# Patient Record
Sex: Female | Born: 1990 | Hispanic: No | Marital: Married | Smoking: Never smoker
Health system: Southern US, Community
[De-identification: ages and names within clinical notes are randomized; demographics above are authoritative.]

## PROBLEM LIST (undated history)

## (undated) ENCOUNTER — Emergency Department (HOSPITAL_COMMUNITY): Payer: Medicaid Other

## (undated) DIAGNOSIS — Z789 Other specified health status: Secondary | ICD-10-CM

---

## 2019-10-19 NOTE — L&D Delivery Note (Signed)
OB/GYN Faculty Practice Delivery Note  Catherine Holder is a 29 y.o. G1P0 s/p SVD at [redacted]w[redacted]d. She was admitted for IOL for decreased fetal movement.   ROM: 0h 26m with clear fluid GBS Status:  Negative/-- (08/18 1123) Maximum Maternal Temperature: n/a  Labor Progress: . Initial SVE: 1/thick/high. Patient received two doses of cytotec, a FB, and pitocin.  AROM at time that patient was complete.   Delivery Date/Time: 9/9 0623  Delivery: Called to room and patient was complete and pushing. Head delivered ROA. Nuchal cord present and reduced. Shoulder and body delivered in usual fashion. Infant with spontaneous cry, placed on mother's abdomen, dried and stimulated. Cord clamped x 2 after 1-minute delay, and cut by FOB. Cord blood drawn. Placenta delivered spontaneously with gentle cord traction. Fundus firm with massage and Pitocin. Labia, perineum, vagina, and cervix inspected inspected with second degree perineal laceration, repaired in routine fashion with 3-0 Vicryl.   Baby Weight: pending  Placenta: Sent to L&D Complications: None Lacerations: second degree perineal, repaired EBL: 100 mL Analgesia: Epidural    Infant:  APGAR (1 MIN):  Pending APGAR (5 MINS):  Pending APGAR (10 MINS):     Casper Harrison, MD [facility] Family Medicine Fellow, [facility] for [facility], [facility] 06/26/2020, 7:32 AM

## 2019-12-27 ENCOUNTER — Ambulatory Visit (INDEPENDENT_AMBULATORY_CARE_PROVIDER_SITE_OTHER): Payer: Self-pay

## 2019-12-27 DIAGNOSIS — Z3A00 Weeks of gestation of pregnancy not specified: Secondary | ICD-10-CM

## 2019-12-27 DIAGNOSIS — Z3400 Encounter for supervision of normal first pregnancy, unspecified trimester: Secondary | ICD-10-CM | POA: Insufficient documentation

## 2019-12-27 DIAGNOSIS — Z3401 Encounter for supervision of normal first pregnancy, first trimester: Secondary | ICD-10-CM

## 2019-12-27 NOTE — Progress Notes (Signed)
Patient seen and assessed by nursing staff during this encounter. I have reviewed the chart and agree with the documentation and plan.  Catalina Antigua, MD 12/27/2019 9:45 AM

## 2019-12-27 NOTE — Progress Notes (Signed)
I connected with  Catherine Holder on 12/27/19 by a video enabled telemedicine application and verified that I am speaking with the correct person using two identifiers.   OB History    Gravida  1   Para      Term      Preterm      AB      Living        SAB      TAB      Ectopic      Multiple      Live Births             Assessment   Normal pregnany   Objective     Plan   F/u 01/03/20 for new ob visit

## 2020-01-03 ENCOUNTER — Other Ambulatory Visit: Payer: Self-pay

## 2020-01-03 ENCOUNTER — Other Ambulatory Visit (HOSPITAL_COMMUNITY)
Admission: RE | Admit: 2020-01-03 | Discharge: 2020-01-03 | Disposition: A | Discharge status: Home / Self Care | Payer: Medicaid Other | Source: Ambulatory Visit | Attending: Obstetrics & Gynecology | Admitting: Obstetrics & Gynecology

## 2020-01-03 ENCOUNTER — Encounter: Payer: Self-pay | Admitting: Obstetrics & Gynecology

## 2020-01-03 ENCOUNTER — Ambulatory Visit (INDEPENDENT_AMBULATORY_CARE_PROVIDER_SITE_OTHER): Payer: Medicaid Other | Admitting: Obstetrics & Gynecology

## 2020-01-03 VITALS — BP 96/65 | HR 79 | Wt 158.0 lb

## 2020-01-03 DIAGNOSIS — Z3401 Encounter for supervision of normal first pregnancy, first trimester: Secondary | ICD-10-CM | POA: Insufficient documentation

## 2020-01-03 DIAGNOSIS — Z3402 Encounter for supervision of normal first pregnancy, second trimester: Secondary | ICD-10-CM

## 2020-01-03 DIAGNOSIS — Z3A22 22 weeks gestation of pregnancy: Secondary | ICD-10-CM

## 2020-01-03 DIAGNOSIS — Z3A15 15 weeks gestation of pregnancy: Secondary | ICD-10-CM

## 2020-01-03 MED ORDER — VITAFOL GUMMIES 3.33-0.333-34.8 MG PO CHEW: 3.0000 | CHEWABLE_TABLET | Freq: Every day | ORAL | 11 refills | Status: DC

## 2020-01-03 NOTE — Progress Notes (Signed)
History:   Catherine Holder is a 29 y.o. G1P0 at [redacted]w[redacted]d by LMP being seen today for her first obstetrical visit. No issues since finding out she was pregnant, no concerns.      HISTORY: OB History  Gravida Para Term Preterm AB Living  1 0 0 0 0 0  SAB TAB Ectopic Multiple Live Births  0 0 0 0 0    # Outcome Date GA Lbr Len/2nd Weight Sex Delivery Anes PTL Lv  1 Current            History reviewed. No pertinent past medical history. History reviewed. No pertinent surgical history. History reviewed. No pertinent family history. Social History   Tobacco Use  . Smoking status: Never Smoker  . Smokeless tobacco: Never Used  Substance Use Topics  . Alcohol use: Never  . Drug use: Never   No Known Allergies No current outpatient medications on file prior to visit.   No current facility-administered medications on file prior to visit.    Review of Systems Pertinent items noted in HPI and remainder of comprehensive ROS otherwise negative. Physical Exam:   Vitals:   01/03/20 0922  BP: 96/65  Pulse: 79  Weight: 158 lb (71.7 kg)   Fetal Heart Rate (bpm): 155 Uterus:     Pelvic Exam: Perineum: no hemorrhoids, normal perineum   Vulva: normal external genitalia, no lesions   Vagina:  normal mucosa, normal discharge   Cervix: unable to visualize, pap smear was unable to be done secondary to patient's inability to tolerate speculum exam   Adnexa: not examined   Bony Pelvis: average  System: General: well-developed, well-nourished female in no acute distress   Breasts:  normal appearance, no masses or tenderness bilaterally   Skin: normal coloration and turgor, no rashes   Neurologic: oriented, normal, negative, normal mood   Extremities: normal strength, tone, and muscle mass, ROM of all joints is normal   HEENT PERRLA, extraocular movement intact and sclera clear, anicteric   Mouth/Teeth mucous membranes moist, pharynx normal without lesions and dental hygiene good   Neck  supple and no masses   Cardiovascular: regular rate and rhythm   Respiratory:  no respiratory distress, normal breath sounds   Abdomen: soft, non-tender; bowel sounds normal; no masses,  no organomegaly    Assessment:    Pregnancy: G1P0 Patient Active Problem List   Diagnosis Date Noted  . Encounter for supervision of normal first pregnancy 12/27/2019     Plan:    1. Encounter for supervision of normal first pregnancy in second trimester - Cervicovaginal ancillary only( [place]) - Obstetric Panel, Including HIV - Genetic Screening - Culture, OB Urine - Prenatal Vit-Fe Phos-FA-Omega (VITAFOL GUMMIES) 3.33-0.333-34.8 MG CHEW; Chew 3 each by mouth daily.  Dispense: 90 tablet; Refill: 11 - [place] MFM OB COMP + 14 WK; Future - Hepatitis C antibody - Hemoglobin A1c - Comprehensive metabolic panel - AFP, Serum, Open Spina Bifida Initial labs drawn. Pap smear to be attempted again postpartum.  Continue prenatal vitamins. Genetic Screening discussed, NIPS: ordered. Ultrasound discussed; fetal anatomic survey: ordered. Problem list reviewed and updated. The nature of [place] [facility] Practice with multiple MDs and other Advanced Practice Providers was explained to patient; also emphasized that residents, students are part of our team. Routine obstetric precautions reviewed. Return in about 4 weeks (around 01/31/2020) for Virtual OB Visit.     Jaynie Collins, MD, FACOG Obstetrician & Gynecologist, [facility] for [facility]  Healthcare, [place]

## 2020-01-03 NOTE — Patient Instructions (Signed)

## 2020-01-04 LAB — CERVICOVAGINAL ANCILLARY ONLY
Bacterial Vaginitis (gardnerella): NEGATIVE
Candida Glabrata: NEGATIVE
Candida Vaginitis: NEGATIVE
Chlamydia: NEGATIVE
Comment: NEGATIVE
Comment: NEGATIVE
Comment: NEGATIVE
Comment: NEGATIVE
Comment: NEGATIVE
Comment: NORMAL
Neisseria Gonorrhea: NEGATIVE
Trichomonas: NEGATIVE

## 2020-01-05 LAB — URINE CULTURE, OB REFLEX

## 2020-01-05 LAB — CULTURE, OB URINE

## 2020-01-09 LAB — OBSTETRIC PANEL, INCLUDING HIV
Antibody Screen: NEGATIVE
Basophils Absolute: 0 10*3/uL (ref 0.0–0.2)
Basos: 0 %
EOS (ABSOLUTE): 0.1 10*3/uL (ref 0.0–0.4)
Eos: 1 %
HIV Screen 4th Generation wRfx: NONREACTIVE
Hematocrit: 40.3 % (ref 34.0–46.6)
Hemoglobin: 13.6 g/dL (ref 11.1–15.9)
Hepatitis B Surface Ag: NEGATIVE
Immature Grans (Abs): 0 10*3/uL (ref 0.0–0.1)
Immature Granulocytes: 0 %
Lymphocytes Absolute: 1.7 10*3/uL (ref 0.7–3.1)
Lymphs: 19 %
MCH: 31.5 pg (ref 26.6–33.0)
MCHC: 33.7 g/dL (ref 31.5–35.7)
MCV: 93 fL (ref 79–97)
Monocytes Absolute: 0.5 10*3/uL (ref 0.1–0.9)
Monocytes: 5 %
Neutrophils Absolute: 6.7 10*3/uL (ref 1.4–7.0)
Neutrophils: 75 %
Platelets: 219 10*3/uL (ref 150–450)
RBC: 4.32 x10E6/uL (ref 3.77–5.28)
RDW: 12.1 % (ref 11.7–15.4)
RPR Ser Ql: NONREACTIVE
Rh Factor: POSITIVE
Rubella Antibodies, IGG: 8.49 index (ref >0.99)
WBC: 9 10*3/uL (ref 3.4–10.8)

## 2020-01-09 LAB — COMPREHENSIVE METABOLIC PANEL
ALT: 7 IU/L (ref 0–32)
AST: 11 IU/L (ref 0–40)
Albumin/Globulin Ratio: 1.7 (ref 1.2–2.2)
Albumin: 4.3 g/dL (ref 3.9–5.0)
Alkaline Phosphatase: 40 IU/L (ref 39–117)
BUN/Creatinine Ratio: 10 (ref 9–23)
BUN: 6 mg/dL (ref 6–20)
Bilirubin Total: <0.2 mg/dL (ref 0.0–1.2)
CO2: 19 mmol/L — ABNORMAL LOW (ref 20–29)
Calcium: 9.3 mg/dL (ref 8.7–10.2)
Chloride: 104 mmol/L (ref 96–106)
Creatinine, Ser: 0.58 mg/dL (ref 0.57–1.00)
GFR calc Af Amer: 143 mL/min/{1.73_m2} (ref >59)
GFR calc non Af Amer: 124 mL/min/{1.73_m2} (ref >59)
Globulin, Total: 2.6 g/dL (ref 1.5–4.5)
Glucose: 85 mg/dL (ref 65–99)
Potassium: 3.8 mmol/L (ref 3.5–5.2)
Sodium: 139 mmol/L (ref 134–144)
Total Protein: 6.9 g/dL (ref 6.0–8.5)

## 2020-01-09 LAB — AFP, SERUM, OPEN SPINA BIFIDA
AFP MoM: 0.88
AFP Value: 25.7 ng/mL
Gest. Age on Collection Date: 15.3 weeks
Maternal Age At EDD: 30.5 yr
OSBR Risk 1 IN: 10000
Test Results:: NEGATIVE
Weight: 158 [lb_av]

## 2020-01-09 LAB — HEPATITIS C ANTIBODY: Hep C Virus Ab: <0.1 s/co ratio (ref 0.0–0.9)

## 2020-01-09 LAB — HEMOGLOBIN A1C
Est. average glucose Bld gHb Est-mCnc: 100 mg/dL
Hgb A1c MFr Bld: 5.1 % (ref 4.8–5.6)

## 2020-01-21 ENCOUNTER — Other Ambulatory Visit: Payer: Self-pay | Admitting: *Deleted

## 2020-01-21 MED ORDER — PRENATE ELITE 26-0.6-0.4 MG PO TABS: 1.0000 | ORAL_TABLET | Freq: Every day | ORAL | 11 refills | Status: DC

## 2020-01-21 NOTE — Progress Notes (Signed)
Pt request new PNV that does not consist of Gelatin. prenate Elite sent today.

## 2020-01-29 ENCOUNTER — Ambulatory Visit (HOSPITAL_COMMUNITY)
Admission: RE | Admit: 2020-01-29 | Discharge: 2020-01-29 | Disposition: A | Discharge status: Home / Self Care | Payer: Medicaid Other | Source: Ambulatory Visit | Attending: Obstetrics & Gynecology | Admitting: Obstetrics & Gynecology

## 2020-01-29 ENCOUNTER — Other Ambulatory Visit: Payer: Self-pay

## 2020-01-29 DIAGNOSIS — Z3402 Encounter for supervision of normal first pregnancy, second trimester: Secondary | ICD-10-CM | POA: Diagnosis present

## 2020-01-29 DIAGNOSIS — Z3A19 19 weeks gestation of pregnancy: Secondary | ICD-10-CM

## 2020-01-31 ENCOUNTER — Telehealth (INDEPENDENT_AMBULATORY_CARE_PROVIDER_SITE_OTHER): Payer: Medicaid Other | Admitting: Certified Nurse Midwife

## 2020-01-31 ENCOUNTER — Other Ambulatory Visit: Payer: Self-pay | Admitting: Obstetrics

## 2020-01-31 ENCOUNTER — Encounter: Payer: Self-pay | Admitting: Obstetrics & Gynecology

## 2020-01-31 ENCOUNTER — Encounter: Payer: Self-pay | Admitting: Certified Nurse Midwife

## 2020-01-31 DIAGNOSIS — Z3402 Encounter for supervision of normal first pregnancy, second trimester: Secondary | ICD-10-CM

## 2020-01-31 DIAGNOSIS — Z3400 Encounter for supervision of normal first pregnancy, unspecified trimester: Secondary | ICD-10-CM

## 2020-01-31 DIAGNOSIS — Z3A19 19 weeks gestation of pregnancy: Secondary | ICD-10-CM

## 2020-01-31 MED ORDER — VITAFOL GUMMIES 3.33-0.333-34.8 MG PO CHEW: 3.0000 | CHEWABLE_TABLET | Freq: Every day | ORAL | 11 refills | Status: DC

## 2020-01-31 MED ORDER — VITAFOL ULTRA 29-0.6-0.4-200 MG PO CAPS: 1.0000 | ORAL_CAPSULE | Freq: Every day | ORAL | 4 refills | Status: DC

## 2020-01-31 MED ORDER — BLOOD PRESSURE KIT DEVI: 1.0000 | 0 refills | Status: DC

## 2020-01-31 NOTE — Progress Notes (Signed)
OBSTETRICS PRENATAL VIRTUAL VISIT ENCOUNTER NOTE  Provider location: [facility] [place] [facility] [place]   I connected with Emelda Holder on 01/31/20 at  2:44 PM EDT by MyChart Video Encounter at home and verified that I am speaking with the correct person using two identifiers.   I discussed the limitations, risks, security and privacy concerns of performing an evaluation and management service virtually and the availability of in person appointments. I also discussed with the patient that there may be a patient responsible charge related to this service. The patient expressed understanding and agreed to proceed. Subjective:  Catherine Holder is a 29 y.o. G1P0 at 58w2dbeing seen today for ongoing prenatal care.  She is currently monitored for the following issues for this low-risk pregnancy and has Encounter for supervision of normal first pregnancy on their problem list.  Patient reports no complaints.  Contractions: Not present. Vag. Bleeding: None.  Movement: Present. Denies any leaking of fluid.   The following portions of the patient's history were reviewed and updated as appropriate: allergies, current medications, past family history, past medical history, past social history, past surgical history and problem list.   Objective:  There were no vitals filed for this visit.  Fetal Status:     Movement: Present     General:  Alert, oriented and cooperative. Patient is in no acute distress.  Respiratory: Normal respiratory effort, no problems with respiration noted  Mental Status: Normal mood and affect. Normal behavior. Normal judgment and thought content.  Rest of physical exam deferred due to type of encounter  Imaging: [place]MFM OB COMP + 14 WK  Result Date: 01/29/2020 ----------------------------------------------------------------------  OBSTETRICS REPORT                       (Signed Final 01/29/2020 01:55 pm)  ---------------------------------------------------------------------- Patient Info  ID #:       [id number]                         D.O.B.:  0Jan 28, 1992(29 yrs)  Name:       Catherine Holder                Visit Date: 01/29/2020 11:22 am ---------------------------------------------------------------------- Performed By  Performed By:     HValda Favia         Ref. Address:     [address]  [place]                                                             [place]  Attending:        Sander Nephew      [facility]                                       [facility]  Referred By:      [facility] ---------------------------------------------------------------------- Orders   #  Description                          Code         Ordered By   1  [place] MFM OB COMP + 59 WK               76805.01     UGONNA ANYANWU  ----------------------------------------------------------------------   #  Order #                    Accession #                 Episode #   1  [id number]                  [id number]                  [id number]  ---------------------------------------------------------------------- Indications   [redacted] weeks gestation of pregnancy                Z3A.19   Encounter for antenatal screening for          Z36.3   malformations   AFP neg  ---------------------------------------------------------------------- Fetal Evaluation  Num Of Fetuses:         1  Fetal Heart Rate(bpm):  144  Cardiac Activity:       Observed  Presentation:           Breech  Placenta:               Posterior  P. Cord Insertion:      Visualized, central  Amniotic Fluid  AFI FV:      Within normal limits                              Largest Pocket(cm)                              3.73  ---------------------------------------------------------------------- Biometry  BPD:      42.3  mm     G. Age:  18w 6d         42  %    CI:        75.65   %    70 - 86  FL/HC:      18.0   %    16.1 - 18.3  HC:      154.2  mm     G. Age:  18w 3d         16  %    HC/AC:      1.03        1.09 - 1.39  AC:      149.2  mm     G. Age:  20w 1d         82  %    FL/BPD:     65.7   %  FL:       27.8  mm     G. Age:  18w 4d         26  %    FL/AC:      18.6   %    20 - 24  HUM:      28.1  mm     G. Age:  19w 0d         51  %  CER:        19  mm     G. Age:  18w 4d         35  %  CM:        5.9  mm  Est. FW:     284  gm    0 lb 10 oz      63  % ---------------------------------------------------------------------- OB History  Gravidity:    1         Term:   0        Prem:   0        SAB:   0  TOP:          0       Ectopic:  0        Living: 0 ---------------------------------------------------------------------- Gestational Age  LMP:           19w 0d        Date:  09/18/19                 EDD:   06/24/20  U/S Today:     19w 0d                                        EDD:   06/24/20  Best:          19w 0d     Det. By:  LMP  (09/18/19)          EDD:   06/24/20 ---------------------------------------------------------------------- Anatomy  Cranium:               Appears normal         LVOT:                   Not well visualized  Cavum:                 Appears normal         Aortic Arch:            Not well visualized  Ventricles:            Appears normal         Ductal Arch:            Not well visualized  Choroid  Plexus:        Appears normal         Diaphragm:              Appears normal  Cerebellum:            Appears normal         Stomach:                Appears normal, left                                                                        sided  Posterior Fossa:       Appears normal         Abdomen:                Appears normal  Nuchal Fold:           Appears  normal         Abdominal Wall:         Not well visualized  Face:                  Appears normal         Cord Vessels:           Appears normal (3                         (orbits and profile)                           vessel cord)  Lips:                  Appears normal         Kidneys:                Appear normal  Palate:                Not well visualized    Bladder:                Appears normal  Thoracic:              Appears normal         Spine:                  Ltd views no                                                                        intracranial signs of  NTD  Heart:                 Not well visualized    Upper Extremities:      Appears normal  RVOT:                  Not well visualized    Lower Extremities:      Appears normal  Other:  Heels and 5th digit visualized. Nasal bone visualized. Technically          difficult due to fetal position. ---------------------------------------------------------------------- Cervix Uterus Adnexa  Cervix  Length:            3.6  cm.  Normal appearance by transabdominal scan.  Uterus  No abnormality visualized.  Left Ovary  No adnexal mass visualized.  Right Ovary  No adnexal mass visualized.  Cul De Sac  No free fluid seen.  Adnexa  No abnormality visualized. ---------------------------------------------------------------------- Impression  Normal anatomy consistent with dates, however, due to fetal  position suboptimal views of the fetal heart was obtained.  Good fetal movement and amniotic fluid volume observed  today. ---------------------------------------------------------------------- Recommendations  Follow up growth in 4 weeks to clear fetal anatomy. ----------------------------------------------------------------------               Sander Nephew, MD Electronically Signed Final Report   01/29/2020 01:55 pm  ----------------------------------------------------------------------   Assessment and Plan:  Pregnancy: G1P0 at 43w2d1. Encounter for supervision of normal first pregnancy in second trimester - Patient doing well, no complaints  - feeling fetal movement, once or twice a day. Educated and discussed fetal movement during pregnancy at this stage  - routine prenatal care - Discussed recommended weight gain during pregnancy  - Anticipatory guidance on upcoming appointments with next being mychart visit  - BP cuff ordered today, encouraged patient to start entering BP into babyscripts once she receive BP cuff  - Patient to have follow up [place] 4 weeks for completion of anatomy  - Blood Pressure Monitoring (BLOOD PRESSURE KIT) DEVI; 1 kit by Does not apply route once a week. Check Blood Pressure regularly and record readings into the Babyscripts App.  Large Cuff.  DX O90.0  Dispense: 1 each; Refill: 0 - UK[place]MFM OB FOLLOW UP; Future - Enroll Patient in Babyscripts - Babyscripts Schedule Optimization  Preterm labor symptoms and general obstetric precautions including but not limited to vaginal bleeding, contractions, leaking of fluid and fetal movement were reviewed in detail with the patient. I discussed the assessment and treatment plan with the patient. The patient was provided an opportunity to ask questions and all were answered. The patient agreed with the plan and demonstrated an understanding of the instructions. The patient was advised to call back or seek an in-person office evaluation/[facility]. Please refer to After Visit Summary for other counseling recommendations.   I provided 15 minutes of face-to-face time during this encounter.  Return in about 4 weeks (around 02/28/2020) for ROB-mychart.  Future Appointments  Date Time Provider DCity of [place] 02/28/2020  3:00 PM [place], LKathie Dike CNM [place]     [place]aj[place] W[place] [place]

## 2020-01-31 NOTE — Progress Notes (Signed)
I connected with  Catherine Holder on 01/31/20 by a video enabled telemedicine application and verified that I am speaking with the correct person using two identifiers.   I discussed the limitations of evaluation and management by telemedicine. The patient expressed understanding and agreed to proceed.   Mychart OB, c/o intermittent back pain 4/10 x 1 month whenever is standing for long.

## 2020-02-26 ENCOUNTER — Other Ambulatory Visit: Payer: Self-pay

## 2020-02-26 ENCOUNTER — Ambulatory Visit: Payer: Medicaid Other | Attending: Certified Nurse Midwife

## 2020-02-26 DIAGNOSIS — Z3A23 23 weeks gestation of pregnancy: Secondary | ICD-10-CM | POA: Diagnosis not present

## 2020-02-26 DIAGNOSIS — Z362 Encounter for other antenatal screening follow-up: Secondary | ICD-10-CM

## 2020-02-26 DIAGNOSIS — Z3402 Encounter for supervision of normal first pregnancy, second trimester: Secondary | ICD-10-CM | POA: Diagnosis present

## 2020-02-28 ENCOUNTER — Telehealth (INDEPENDENT_AMBULATORY_CARE_PROVIDER_SITE_OTHER): Payer: Medicaid Other | Admitting: Advanced Practice Midwife

## 2020-02-28 VITALS — BP 108/62 | HR 79

## 2020-02-28 DIAGNOSIS — Z3402 Encounter for supervision of normal first pregnancy, second trimester: Secondary | ICD-10-CM

## 2020-02-28 DIAGNOSIS — O2242 Hemorrhoids in pregnancy, second trimester: Secondary | ICD-10-CM

## 2020-02-28 DIAGNOSIS — Z3A23 23 weeks gestation of pregnancy: Secondary | ICD-10-CM

## 2020-02-28 MED ORDER — DOCUSATE SODIUM 100 MG PO CAPS: 100.0000 mg | ORAL_CAPSULE | Freq: Two times a day (BID) | ORAL | 2 refills | Status: DC | PRN

## 2020-02-28 NOTE — Progress Notes (Signed)
S/w pt for virtual visit, pt reports fetal movement, denies pain. 

## 2020-02-28 NOTE — Progress Notes (Signed)
OBSTETRICS PRENATAL VIRTUAL VISIT ENCOUNTER NOTE  Provider location: [facility] for [facility]   I connected with Catherine Holder on 02/28/20 at  3:00 PM EDT by MyChart Video Encounter at home and verified that I am speaking with the correct person using two identifiers.   I discussed the limitations, risks, security and privacy concerns of performing an evaluation and management service virtually and the availability of in person appointments. I also discussed with the patient that there may be a patient responsible charge related to this service. The patient expressed understanding and agreed to proceed. Subjective:  Catherine Holder is a 29 y.o. G1P0 at [redacted]w[redacted]d being seen today for ongoing prenatal care.  She is currently monitored for the following issues for this low-risk pregnancy and has Encounter for supervision of normal first pregnancy on their problem list.  Patient reports hemorrhoid pain last week.  Contractions: Not present. Vag. Bleeding: None.  Movement: Present. Denies any leaking of fluid.   The following portions of the patient's history were reviewed and updated as appropriate: allergies, current medications, past family history, past medical history, past social history, past surgical history and problem list.   Objective:   Vitals:   02/28/20 1355  BP: 108/62  Pulse: 79    Fetal Status:     Movement: Present     General:  Alert, oriented and cooperative. Patient is in no acute distress.  Respiratory: Normal respiratory effort, no problems with respiration noted  Mental Status: Normal mood and affect. Normal behavior. Normal judgment and thought content.  Rest of physical exam deferred due to type of encounter  Imaging: [place] MFM OB FOLLOW UP  Result Date: 02/26/2020 ----------------------------------------------------------------------  OBSTETRICS REPORT                       (Signed Final 02/26/2020 09:27 pm)  ---------------------------------------------------------------------- Patient Info  ID #:       [id number]                          D.O.B.:  05-Mar-1991 (29 yrs)  Name:       Catherine Holder                 Visit Date: 02/26/2020 10:53 am ---------------------------------------------------------------------- Performed By  Attending:        Lin Landsman      Ref. Address:      [address]                    [address]                                                              [address]                                                              [phone]  [place]                                                              [phone]  Performed By:     Percell BostonHeather Waken          [facility]  [facility]      [facility] ---------------------------------------------------------------------- Orders  #  Description                           Code        Ordered By  1  US MFM OB FOLLOW UP                   [id number]    Steward DroneVERONICA ROGERS ----------------------------------------------------------------------  #  Order #                     Accession #                Episode #  1  [id number]                   [id number]                 [id number] ---------------------------------------------------------------------- Indications  [redacted] weeks gestation of pregnancy                 Z3A.23  AFP neg  Encounter for other antenatal screening         Z36.2  follow-up ---------------------------------------------------------------------- Fetal Evaluation  Num Of Fetuses:          1  Fetal Heart Rate(bpm):   143  Cardiac Activity:        Observed  Presentation:            Cephalic  Placenta:                Posterior  P. Cord Insertion:       Previously Visualized  Amniotic Fluid  AFI FV:      Within normal limits                              Largest Pocket(cm)                              5.3  ---------------------------------------------------------------------- Biometry  BPD:      57.4  mm     G. Age:  23w 4d         67  %    CI:        76.06   %    70 - 86  FL/HC:       19.2  %    19.2 - 20.8  HC:      208.6  mm     G. Age:  23w 0d         33  %    HC/AC:       1.10       1.05 - 1.21  AC:      188.8  mm     G. Age:  23w 4d         63  %    FL/BPD:      69.7  %    71 - 87  FL:         40  mm     G. Age:  22w 6d         35  %    FL/AC:       21.2  %    20 - 24  Est. FW:     581   gm     1 lb 4 oz     57  % ---------------------------------------------------------------------- OB History  Gravidity:    1         Term:   0        Prem:   0        SAB:   0  TOP:          0       Ectopic:  0        Living: 0 ---------------------------------------------------------------------- Gestational Age  LMP:           23w 0d        Date:  09/18/19                 EDD:   06/24/20  U/S Today:     23w 2d                                        EDD:   06/22/20  Best:          23w 0d     Det. By:  LMP  (09/18/19)          EDD:   06/24/20 ---------------------------------------------------------------------- Anatomy  Cranium:               Appears normal         LVOT:                   Appears normal  Cavum:                 Previously seen        Aortic Arch:            Appears normal  Ventricles:            Appears normal         Ductal Arch:            Appears normal  Choroid Plexus:        Previously seen        Diaphragm:              Previously seen  Cerebellum:            Previously seen        Stomach:  Appears normal, left                                                                        sided  Posterior Fossa:       Previously seen        Abdomen:                Previously seen  Nuchal Fold:           Previously seen        Abdominal Wall:         Appears nml (cord                                                                         insert, abd wall)  Face:                  Orbits and profile     Cord Vessels:           Previously seen                         previously seen  Lips:                  Previously seen        Kidneys:                Appear normal  Palate:                Not well visualized    Bladder:                Appears normal  Thoracic:              Appears normal         Spine:                  Ltd views no                                                                        intracranial signs of                                                                        NTD  Heart:                 Appears normal         Upper Extremities:  Previously seen                         (4CH, axis, and                         situs)  RVOT:                  Appears normal         Lower Extremities:      Previously seen  Other:  Heels and 5th digit visualized prev. Nasal bone visualized prev.          Technically difficult due to fetal position. ---------------------------------------------------------------------- Cervix Uterus Adnexa  Cervix  Length:            3.1  cm.  Normal appearance by transabdominal scan.  Uterus  No abnormality visualized.  Right Ovary  No adnexal mass visualized.  Left Ovary  No adnexal mass visualized.  Cul De Sac  No free fluid seen.  Adnexa  No abnormality visualized. ---------------------------------------------------------------------- Impression  Normal interval growth  Suboptimal views of the fetal spine normal intracranial  anatomy.  Good fetal movement amniotic fluid ---------------------------------------------------------------------- Recommendations  Follow growth as clinically indicated ----------------------------------------------------------------------               Sander Nephew, MD Electronically Signed Final Report   02/26/2020 09:27 pm ----------------------------------------------------------------------   Assessment and Plan:  Pregnancy: G1P0 at [redacted]w[redacted]d 1. Encounter for  supervision of normal first pregnancy in second trimester --Pt reports good fetal movement, denies cramping, LOF, or vaginal bleeding --Anticipatory guidance about next visits/weeks of pregnancy given. --Reviewed normal anatomy US results --Next visit in 4 weeks in office for GTT  2. Hemorrhoids during pregnancy in second trimester --Pain last week, no problems now. Discussed preventing constipation with high fiber diet, increased PO fluids.  Rx for Colace if needed. - docusate sodium (COLACE) 100 MG capsule; Take 1 capsule (100 mg total) by mouth 2 (two) times daily as needed.  Dispense: 30 capsule; Refill: 2  Preterm labor symptoms and general obstetric precautions including but not limited to vaginal bleeding, contractions, leaking of fluid and fetal movement were reviewed in detail with the patient. I discussed the assessment and treatment plan with the patient. The patient was provided an opportunity to ask questions and all were answered. The patient agreed with the plan and demonstrated an understanding of the instructions. The patient was advised to call back or seek an in-person office evaluation/go to [facility] for any urgent or concerning symptoms. Please refer to After Visit Summary for other counseling recommendations.   I provided 10 minutes of face-to-face time during this encounter.  No follow-ups on file.  Future Appointments  Date Time Provider [place]  02/28/2020  3:00 PM Left[place]-Kirby, Kathie Dike, CNM [place] None    Fatima Blank, [place] for [employer], [place]

## 2020-03-27 ENCOUNTER — Other Ambulatory Visit: Payer: Self-pay

## 2020-03-27 ENCOUNTER — Ambulatory Visit (INDEPENDENT_AMBULATORY_CARE_PROVIDER_SITE_OTHER): Payer: Medicaid Other

## 2020-03-27 ENCOUNTER — Other Ambulatory Visit: Payer: Medicaid Other

## 2020-03-27 VITALS — BP 101/66 | HR 77 | Wt 176.0 lb

## 2020-03-27 DIAGNOSIS — Z3402 Encounter for supervision of normal first pregnancy, second trimester: Secondary | ICD-10-CM

## 2020-03-27 DIAGNOSIS — H5500 Unspecified nystagmus: Secondary | ICD-10-CM

## 2020-03-27 DIAGNOSIS — Z3A27 27 weeks gestation of pregnancy: Secondary | ICD-10-CM

## 2020-03-27 DIAGNOSIS — F419 Anxiety disorder, unspecified: Secondary | ICD-10-CM | POA: Insufficient documentation

## 2020-03-27 DIAGNOSIS — O99342 Other mental disorders complicating pregnancy, second trimester: Secondary | ICD-10-CM

## 2020-03-27 NOTE — Progress Notes (Signed)
ROB   CC: None    

## 2020-03-27 NOTE — Progress Notes (Signed)
   PRENATAL VISIT NOTE  Subjective:  Catherine Holder is a 29 y.o. G1P0 at [redacted]w[redacted]d being seen today for ongoing prenatal care.  She is currently monitored for the following issues for this low-risk pregnancy and has Encounter for supervision of normal first pregnancy on their problem list.  Patient reports no complaints. Hemorrhoids resolved with increased water intake.  Contractions: Irritability. Vag. Bleeding: None.  Movement: Present. Denies leaking of fluid.   The following portions of the patient's history were reviewed and updated as appropriate: allergies, current medications, past family history, past medical history, past social history, past surgical history and problem list.   Objective:   Vitals:   03/27/20 0908  BP: 101/66  Pulse: 77  Weight: 176 lb (79.8 kg)    Fetal Status: Fetal Heart Rate (bpm): 142   Movement: Present     General:  Alert, oriented and cooperative. Patient is in no acute distress. Some nystagmus noted.   Skin: Skin is warm and dry. No rash noted.   Cardiovascular: Normal heart rate noted  Respiratory: Normal respiratory effort, no problems with respiration noted  Abdomen: Soft, gravid, appropriate for gestational age.  Pain/Pressure: Absent     Pelvic: Cervical exam deferred        Extremities: Normal range of motion.  Edema: None  Mental Status: Normal mood and affect. Normal behavior. Normal judgment and thought content.   Assessment and Plan:  Pregnancy: G1P0 at [redacted]w[redacted]d  1. Encounter for supervision of normal first pregnancy in second trimester - CBC - Glucose Tolerance, 2 Hours w/1 Hour - RPR - HIV Antibody (routine testing w rflx)   Preterm labor symptoms and general obstetric precautions including but not limited to vaginal bleeding, contractions, leaking of fluid and fetal movement were reviewed in detail with the patient. Please refer to After Visit Summary for other counseling recommendations.   Return in about 3 weeks (around  04/17/2020) for Virtual LR-ROB.  No future appointments.  Bayard Beaver, Medical Student

## 2020-03-27 NOTE — Patient Instructions (Signed)

## 2020-03-28 LAB — CBC
Hematocrit: 34.9 % (ref 34.0–46.6)
Hemoglobin: 11.7 g/dL (ref 11.1–15.9)
MCH: 31.8 pg (ref 26.6–33.0)
MCHC: 33.5 g/dL (ref 31.5–35.7)
MCV: 95 fL (ref 79–97)
Platelets: 218 10*3/uL (ref 150–450)
RBC: 3.68 x10E6/uL — ABNORMAL LOW (ref 3.77–5.28)
RDW: 11.8 % (ref 11.7–15.4)
WBC: 8.4 10*3/uL (ref 3.4–10.8)

## 2020-03-28 LAB — GLUCOSE TOLERANCE, 2 HOURS W/ 1HR
Glucose, 1 hour: 109 mg/dL (ref 65–179)
Glucose, 2 hour: 116 mg/dL (ref 65–152)
Glucose, Fasting: 80 mg/dL (ref 65–91)

## 2020-03-28 LAB — RPR: RPR Ser Ql: NONREACTIVE

## 2020-03-28 LAB — HIV ANTIBODY (ROUTINE TESTING W REFLEX): HIV Screen 4th Generation wRfx: NONREACTIVE

## 2020-04-17 ENCOUNTER — Telehealth (INDEPENDENT_AMBULATORY_CARE_PROVIDER_SITE_OTHER): Payer: Medicaid Other | Admitting: Family Medicine

## 2020-04-17 VITALS — BP 101/68 | HR 102

## 2020-04-17 DIAGNOSIS — Z3403 Encounter for supervision of normal first pregnancy, third trimester: Secondary | ICD-10-CM

## 2020-04-17 DIAGNOSIS — G5601 Carpal tunnel syndrome, right upper limb: Secondary | ICD-10-CM

## 2020-04-17 DIAGNOSIS — O99353 Diseases of the nervous system complicating pregnancy, third trimester: Secondary | ICD-10-CM

## 2020-04-17 DIAGNOSIS — Z3A30 30 weeks gestation of pregnancy: Secondary | ICD-10-CM

## 2020-04-17 NOTE — Progress Notes (Signed)
S/w pt for virtual visit, pt reports fetal movement with occasional pressure.

## 2020-04-17 NOTE — Progress Notes (Signed)
° °  TELEHEALTH OBSTETRICS VISIT ENCOUNTER NOTE  I connected with Catherine Holder on 04/17/20 at  1:00 PM EDT by video at home and verified that I am speaking with the correct person using two identifiers.   I discussed the limitations, risks, security and privacy concerns of performing an evaluation and management service by telephone and the availability of in person appointments. I also discussed with the patient that there may be a patient responsible charge related to this service. The patient expressed understanding and agreed to proceed.  Subjective:  Catherine Holder is a 29 y.o. G1P0 at [redacted]w[redacted]d being followed for ongoing prenatal care.  She is currently monitored for the following issues for this low-risk pregnancy and has Encounter for supervision of normal first pregnancy; Anxiety associated with birthing process; and Nystagmus on their problem list.  Patient reports no OB complaints. Does report waking up in the morning with difficult closing fingers 3-5 on right hand. Upon further investigation, she sleeps often with hands and arms curled up next to her body. Reports fetal movement. Denies any contractions, bleeding or leaking of fluid.   The following portions of the patient's history were reviewed and updated as appropriate: allergies, current medications, past family history, past medical history, past social history, past surgical history and problem list.   Objective:   Vitals:   04/17/20 1305  BP: 101/68  Pulse: (!) 102    General:  Alert, oriented and cooperative.   Mental Status: Normal mood and affect perceived. Normal judgment and thought content.  Rest of physical exam deferred due to type of encounter  Assessment and Plan:  Pregnancy: G1P0 at [redacted]w[redacted]d Catherine Holder was seen today for routine prenatal visit.  Diagnoses and all orders for this visit:  Encounter for supervision of normal first pregnancy in third trimester - doing well - reviewed labs and most recent US (f/u  PRN) - boy-no circ, both, unsure - In-person visit in 2 weeks for TDap  Carpal tunnel syndrome of right wrist - Symptoms described and distribution fitting with carpal tunnel syndrome - recommended attempting to keep wrists in neutral position at nigth - if not improved at next visit, consider wrist splint  Preterm labor symptoms and general obstetric precautions including but not limited to vaginal bleeding, contractions, leaking of fluid and fetal movement were reviewed in detail with the patient.  I discussed the assessment and treatment plan with the patient. The patient was provided an opportunity to ask questions and all were answered. The patient agreed with the plan and demonstrated an understanding of the instructions. The patient was advised to call back or seek an in-person office evaluation/go to [facility]U at [facility] for any urgent or concerning symptoms. Please refer to After Visit Summary for other counseling recommendations.   I provided 12 minutes of non-face-to-face time during this encounter.  Return in about 2 weeks (around 05/01/2020) for LOB; in-person for Tdap .  No future appointments.  Catherine Arrow, MD Center for [employer], [facility]

## 2020-05-01 ENCOUNTER — Encounter: Payer: Self-pay | Admitting: Family Medicine

## 2020-05-01 ENCOUNTER — Other Ambulatory Visit: Payer: Self-pay

## 2020-05-01 ENCOUNTER — Ambulatory Visit (INDEPENDENT_AMBULATORY_CARE_PROVIDER_SITE_OTHER): Payer: Medicaid Other | Admitting: Family Medicine

## 2020-05-01 DIAGNOSIS — Z3403 Encounter for supervision of normal first pregnancy, third trimester: Secondary | ICD-10-CM

## 2020-05-01 DIAGNOSIS — Z23 Encounter for immunization: Secondary | ICD-10-CM | POA: Diagnosis not present

## 2020-05-01 DIAGNOSIS — Z3A32 32 weeks gestation of pregnancy: Secondary | ICD-10-CM

## 2020-05-01 NOTE — Patient Instructions (Addendum)

## 2020-05-01 NOTE — Progress Notes (Addendum)
ROB c/o pressure.  TDAP given in RD, tolerated well.

## 2020-05-01 NOTE — Progress Notes (Signed)
Subjective:  Catherine Holder is a 29 y.o. G1P0 at [redacted]w[redacted]d being seen today for ongoing prenatal care.  She is currently monitored for the following issues for this low-risk pregnancy and has Encounter for supervision of normal first pregnancy; Anxiety associated with birthing process; and Nystagmus on their problem list.  Patient reports no complaints.  Contractions: Not present. Vag. Bleeding: None.  Movement: Present. Denies leaking of fluid.   The following portions of the patient's history were reviewed and updated as appropriate: allergies, current medications, past family history, past medical history, past social history, past surgical history and problem list. Problem list updated.  Objective:   Vitals:   05/01/20 1324  BP: 126/77  Pulse: 98  Weight: 182 lb (82.6 kg)    Fetal Status: Fetal Heart Rate (bpm): 147   Movement: Present     General:  Alert, oriented and cooperative. Patient is in no acute distress.  Skin: Skin is warm and dry. No rash noted.   Cardiovascular: Normal heart rate noted  Respiratory: Normal respiratory effort, no problems with respiration noted  Abdomen: Soft, gravid, appropriate for gestational age. Pain/Pressure: Present     Pelvic: Vag. Bleeding: None     Cervical exam deferred        Extremities: Normal range of motion.  Edema: None  Mental Status: Normal mood and affect. Normal behavior. Normal judgment and thought content.   Urinalysis:      Assessment and Plan:  Pregnancy: G1P0 at [redacted]w[redacted]d  1. Encounter for supervision of normal first pregnancy in third trimester - TDAP given today - Continue routine prenatal care  Preterm labor symptoms and general obstetric precautions including but not limited to vaginal bleeding, contractions, leaking of fluid and fetal movement were reviewed in detail with the patient. Please refer to After Visit Summary for other counseling recommendations.  No follow-ups on file.   Eileen Kangas L, DO

## 2020-05-15 ENCOUNTER — Telehealth (INDEPENDENT_AMBULATORY_CARE_PROVIDER_SITE_OTHER): Payer: Medicaid Other

## 2020-05-15 DIAGNOSIS — Z3A34 34 weeks gestation of pregnancy: Secondary | ICD-10-CM

## 2020-05-15 DIAGNOSIS — Z3403 Encounter for supervision of normal first pregnancy, third trimester: Secondary | ICD-10-CM

## 2020-05-15 NOTE — Patient Instructions (Signed)

## 2020-05-15 NOTE — Progress Notes (Signed)
I connected with  Catherine Holder on 05/15/20 by a video enabled telemedicine application and verified that I am speaking with the correct person using two identifiers.   I discussed the limitations of evaluation and management by telemedicine. The patient expressed understanding and agreed to proceed.  Virtual OB, c/o pressure and swollen feet.

## 2020-05-15 NOTE — Progress Notes (Signed)
   OBSTETRICS PRENATAL VIRTUAL VISIT ENCOUNTER NOTE  Provider location: [facility]   I connected with Catherine Holder on 05/15/20 at  1:40 PM EDT by MyChart Video Encounter at home and verified that I am speaking with the correct person using two identifiers.   I discussed the limitations, risks, security and privacy concerns of performing an evaluation and management service virtually and the availability of in person appointments. I also discussed with the patient that there may be a patient responsible charge related to this service. The patient expressed understanding and agreed to proceed. Subjective:  Catherine Holder is a 29 y.o. G1P0 at [redacted]w[redacted]d being seen today for ongoing prenatal care.  She is currently monitored for the following issues for this low-risk pregnancy and has Encounter for supervision of normal first pregnancy; Anxiety associated with birthing process; and Nystagmus on their problem list.  Patient reports pedal edema. Patient reports she is elevating her feet and drinking water. Patient reports some intermittent pressure that is relieved with rest.  Contractions: Not present. Vag. Bleeding: None.  Movement: Present. Denies any leaking of fluid.   The following portions of the patient's history were reviewed and updated as appropriate: allergies, current medications, past family history, past medical history, past social history, past surgical history and problem list.   Objective:   Vitals:   05/15/20 1340  BP: (!) 102/64  Pulse: 85    Fetal Status:     Movement: Present     General:  Alert, oriented and cooperative. Patient is in no acute distress.  Respiratory: Normal respiratory effort, no problems with respiration noted  Mental Status: Normal mood and affect. Normal behavior. Normal judgment and thought content.  Rest of physical exam deferred due to type of encounter  Imaging: No results found.  Assessment and Plan:  Pregnancy:  G1P0 at [redacted]w[redacted]d 1. Encounter for supervision of normal first pregnancy in third trimester -Reviewed circumcision as patient states she now desires. Informed that she can "change her mind" up until the day of the procedure. -Reassured that edema is expected in pregnancy. Encouraged usage of compression stockings, increased hydration, and elevation. -Reassured that pelvic pressure is normal especially as fetus engages into pelvis.  Instructed to contact or go to MAU for pelvic pain. -Anticipatory for upcoming appts. -Educated on GBS bacteria including what it is, why we test, and how and when we treat if needed.  Preterm labor symptoms and general obstetric precautions including but not limited to vaginal bleeding, contractions, leaking of fluid and fetal movement were reviewed in detail with the patient. I discussed the assessment and treatment plan with the patient. The patient was provided an opportunity to ask questions and all were answered. The patient agreed with the plan and demonstrated an understanding of the instructions. The patient was advised to call back or seek an in-person office evaluation/go to MAU at [facility] for any urgent or concerning symptoms. Please refer to After Visit Summary for other counseling recommendations.   I provided 7 minutes of face-to-face time during this encounter.  Return in about 3 weeks (around 06/05/2020) for LROB with GBS.  No future appointments.  Cherre Robins, CNM Center for [employer], [facility]

## 2020-06-04 ENCOUNTER — Encounter: Payer: Self-pay | Admitting: Nurse Practitioner

## 2020-06-04 ENCOUNTER — Other Ambulatory Visit: Payer: Self-pay

## 2020-06-04 ENCOUNTER — Ambulatory Visit (INDEPENDENT_AMBULATORY_CARE_PROVIDER_SITE_OTHER): Payer: Medicaid Other | Admitting: Nurse Practitioner

## 2020-06-04 ENCOUNTER — Other Ambulatory Visit (HOSPITAL_COMMUNITY)
Admission: RE | Admit: 2020-06-04 | Discharge: 2020-06-04 | Disposition: A | Discharge status: Home / Self Care | Payer: Medicaid Other | Source: Ambulatory Visit | Attending: Nurse Practitioner | Admitting: Nurse Practitioner

## 2020-06-04 VITALS — BP 117/77 | HR 78 | Wt 188.8 lb

## 2020-06-04 DIAGNOSIS — F419 Anxiety disorder, unspecified: Secondary | ICD-10-CM

## 2020-06-04 DIAGNOSIS — O99340 Other mental disorders complicating pregnancy, unspecified trimester: Secondary | ICD-10-CM

## 2020-06-04 DIAGNOSIS — Z3403 Encounter for supervision of normal first pregnancy, third trimester: Secondary | ICD-10-CM

## 2020-06-04 DIAGNOSIS — O1203 Gestational edema, third trimester: Secondary | ICD-10-CM

## 2020-06-04 DIAGNOSIS — Z3A37 37 weeks gestation of pregnancy: Secondary | ICD-10-CM

## 2020-06-04 NOTE — Patient Instructions (Signed)

## 2020-06-04 NOTE — Progress Notes (Signed)
    Subjective:  Catherine Holder is a 29 y.o. G1P0 at [redacted]w[redacted]d being seen today for ongoing prenatal care.  She is currently monitored for the following issues for this low-risk pregnancy and has Encounter for supervision of normal first pregnancy; Anxiety associated with birthing process; and Nystagmus on their problem list.  Patient reports very nervous about any cervical exams.  Contractions: Not present. Vag. Bleeding: None.  Movement: Present. Denies leaking of fluid.   The following portions of the patient's history were reviewed and updated as appropriate: allergies, current medications, past family history, past medical history, past social history, past surgical history and problem list. Problem list updated.  Objective:   Vitals:   06/04/20 1015  BP: 117/77  Pulse: 78  Weight: 188 lb 12.8 oz (85.6 kg)    Fetal Status: Fetal Heart Rate (bpm): 136 Fundal Height: 37 cm Movement: Present  Presentation: Vertex  General:  Alert, oriented and cooperative. Patient is in no acute distress.  Skin: Skin is warm and dry. No rash noted.   Cardiovascular: Normal heart rate noted  Respiratory: Normal respiratory effort, no problems with respiration noted  Abdomen: Soft, gravid, appropriate for gestational age. Pain/Pressure: Present     Pelvic:  Cervical exam performed Dilation: Fingertip   Station: -2 Cervix was posterior and only able to do a one finger cervical exam due to discomfort felt by client.  She is very worried about having cervical exams.  Was difficult for her but she was able to tolerate a very slow and gentle exam and vaginal swabs  Extremities: Normal range of motion.  Edema: Mild pitting, slight indentation  Mental Status: Normal mood and affect. Normal behavior. Normal judgment and thought content.   Urinalysis:      Assessment and Plan:  Pregnancy: G1P0 at [redacted]w[redacted]d  1. Encounter for supervision of normal first pregnancy in third trimester Has taken Breastfeeding  classes  2. Anxiety associated with birthing process No childbirth classes but has watched you tube videos of birth  3. Gestational edema in third trimester Hands only - no edema in ankles Reviewed signs of high blood pressure - headache, blurred vision and RUQ pain - denies all    Term labor symptoms and general obstetric precautions including but not limited to vaginal bleeding, contractions, leaking of fluid and fetal movement were reviewed in detail with the patient. Please refer to After Visit Summary for other counseling recommendations.  Return in about 1 week (around 06/11/2020) for in person ROB.  Nolene Bernheim, RN, MSN, NP-BC Nurse Practitioner, [facility] for [employer], [facility] 06/04/2020 5:37 PM

## 2020-06-04 NOTE — Progress Notes (Signed)
labPt presents for ROB Pt. Would like to discuss circ with provider today

## 2020-06-05 LAB — CERVICOVAGINAL ANCILLARY ONLY
Bacterial Vaginitis (gardnerella): NEGATIVE
Candida Glabrata: NEGATIVE
Candida Vaginitis: NEGATIVE
Chlamydia: NEGATIVE
Comment: NEGATIVE
Comment: NEGATIVE
Comment: NEGATIVE
Comment: NEGATIVE
Comment: NEGATIVE
Comment: NORMAL
Neisseria Gonorrhea: NEGATIVE
Trichomonas: NEGATIVE

## 2020-06-09 LAB — CULTURE, BETA STREP (GROUP B ONLY): Strep Gp B Culture: NEGATIVE

## 2020-06-11 ENCOUNTER — Other Ambulatory Visit: Payer: Self-pay

## 2020-06-11 ENCOUNTER — Encounter: Payer: Self-pay | Admitting: Advanced Practice Midwife

## 2020-06-11 ENCOUNTER — Ambulatory Visit (INDEPENDENT_AMBULATORY_CARE_PROVIDER_SITE_OTHER): Payer: Medicaid Other | Admitting: Advanced Practice Midwife

## 2020-06-11 VITALS — BP 134/83 | HR 86 | Wt 191.2 lb

## 2020-06-11 DIAGNOSIS — Z3480 Encounter for supervision of other normal pregnancy, unspecified trimester: Secondary | ICD-10-CM

## 2020-06-11 DIAGNOSIS — Z3A38 38 weeks gestation of pregnancy: Secondary | ICD-10-CM

## 2020-06-11 NOTE — Progress Notes (Signed)
Patient presents for ROB. Patient complains of having pain in her right hand. She states that it is hard for her to close or stretch her hand out. There is some swelling in that hand as well.

## 2020-06-11 NOTE — Patient Instructions (Signed)
COVID-19 Vaccination if You Are Pregnant or Breastfeeding ° °The Society for Maternal-Fetal Medicine (SMFM) and other pregnancy experts recommend that pregnant and lactating people be vaccinated against COVID-19. The Centers for Disease Control and Prevention (CDC) also recommend vaccination for “all people aged 30 years and older, including people who are pregnant, breastfeeding, trying to get pregnant now, or might become pregnant in the future.” Vaccination is the best way to reduce the risks of COVID-19 infection and COVID-related complications for both you and your baby. ° °Three vaccines are available to prevent COVID-19: °• The two-dose Pfizer vaccine for people 12 years and older--APPROVED by the [place] Food and Drug Administration on June 09, 2020 °• The two-dose Moderna vaccine for people 18 years and older--AUTHORIZED for emergency use °• The one-dose Johnson & Johnson vaccine for people 18 years and older (you may also see this vaccine referred to as the “Janssen vaccine”)--AUTHORIZED for emergency use ° °For those receiving the Pfizer and Moderna vaccines, the second dose is given 21 days (Pfizer) and 28 days (Moderna) after the first dose. The Johnson & Johnson vaccine is only one dose. ° °Information for Pregnant Individuals °If you are pregnant or planning to become pregnant and are thinking about getting vaccinated, consider talking with your health care professional about the vaccine.  ° °To help with your decision, you should consider the following key points: °Anyone can get the COVID vaccines free of charge regardless of immigration status or whether they have insurance. You may be asked for your social security number, but it is  °NOT required to get vaccinated. ° °What are benefits of getting the COVID-19 vaccines during pregnancy?  °• The vaccines can help protect you from getting COVID-19. With the two-dose vaccines, you must get both doses for maximum effectiveness. It’s not yet known how  long protection lasts. ° °• Another potential benefit is that getting the vaccine while pregnant may help you pass antiCOVID-19 antibodies to your baby. In numerous studies of vaccinated moms, antibodies were found in the umbilical cord blood of babies and in the mother’s breastmilk. ° °• The CDC, along with other federal partners, are monitoring people who have been vaccinated for serious side effects. So far, more than 139,000 pregnant people have been °vaccinated. No unexpected pregnancy or fetal problems have occurred. There have been no reports of any increased risk of pregnancy loss, growth problems, or birth defects. ° °• A safe vaccine is generally considered one in which the benefits of being vaccinated outweigh the risks. The current vaccines are not live vaccines. There is only a very small  °chance that they cross the placenta, so it’s unlikely that they even reach the fetus. Vaccines don’t affect future fertility. The only people who should NOT get vaccinated are those who have had a severe allergic reaction to vaccines in the past or any vaccine ingredients. ° °• Side effects may occur in the first 3 days after getting vaccinated.1 These include mild to moderate fever, headache, and muscle aches. Side effects may be worse after the second dose of the Pfizer and Moderna vaccines. Fever should be avoided during pregnancy,especially in the first trimester. Those who develop a fever after vaccination can take °acetaminophen (Tylenol). This medication is safe to use during pregnancy and does not  affect how the vaccine works.  ° °What are the known risks of getting COVID-19 during pregnancy?  °About 1 to 3 per 1,000 pregnant women with COVID-19 will develop severe disease. Compared with those who   aren’t pregnant, pregnant people infected by the COVID-19 virus: °• Are 3 times more likely to need ICU care °• Are 2 to 3 times more likely to need advanced life support and a breathing tube  °• Have a small  increased risk of dying due to COVID-19 °They may also be at increased risk of stillbirth and preterm birth. ° °What is my risk of getting COVID-19?  °Your risk of getting COVID-19 depends on the chance that you will come into contact with another infected person. The risk may be higher if you live in a community where there is a lot of COVID-19 infection or work in healthcare or another high-contact setting.  ° °What is my risk for severe complications if I get COVID-19?  °Data show that older pregnant women; those with preexisting health conditions, such as a body mass index higher than 35 kg/m2, diabetes, and heart disorders; and Black or Latinx women have an especially increased risk of severe disease and death from COVID-19. °  °If you still have questions about the vaccines or need more information, ask your health care provider or go to the Centers for Disease Control and Prevention’s COVID-19 vaccine webpage.  ° °An Update on the Johnson & Johnson Vaccine  ° °In April 2021, the FDA and CDC called for a brief pause to use of the Johnson & Johnson vaccine. They did so after reports of a severe side effect in a very small number of women younger than age 50 following vaccination. This side effect, called thrombosis with thrombocytopenia syndrome (TTS), causes blood clots (thrombosis) combined with low levels of platelets (thrombocytopenia). ° °TTS following the Johnson & Johnson vaccine is extremely rare. At the time of this update, it has occurred in only 7 people per 1 million Johnson & Johnson shots given. According to the CDC, being on hormonal birth control (the pill, patch, or ring), pregnancy, breastfeeding, or being recently pregnant does not make you more likely to develop TTS after getting the Johnson & Johnson vaccine. The pause was lifted on February 08, 2020, after the FDA and CDC determined that the known benefits of the Johnson & Johnson vaccine far outweigh the risks.  ° °Health care professionals  have been alerted to the possibility of this side effect in people who have received the Johnson & Johnson vaccine. National organizations continue to recommend COVID-19 vaccination with any of the vaccines for pregnant women. All women younger than age 50 years, whether pregnant, breastfeeding, or not, should be aware of the very rare risk of TTS after getting the Johnson & Johnson vaccine. The Pfizer and Moderna vaccines don’t have this risk. If you get the Johnson & Johnson vaccine,  °seek medical help right away if you develop any of the following symptoms within 3 weeks of getting your shot: ° °• Severe or persistent headaches or blurred vision °• Shortness of breath °• Chest pain °• Leg swelling °• Persistent abdominal pain °• Easy bruising or tiny blood spots under the skin beyond the injection site ° °Experts continue to collect health and safety information from pregnant people who have been vaccinated. If you have questions about vaccination during pregnancy, visit the CDC website or talk to your health care professional. Information for Breastfeeding/Lactating Individuals The Society for Maternal-Fetal Medicine and other pregnancy experts recommend COVID-19 vaccination for people who are breastfeeding/lactating. You don’t have to delay or stop  °breastfeeding just because you get vaccinated.  ° °Getting Vaccinated  °You can get vaccinated at   any time during pregnancy. The CDC is committed to monitoring the vaccine’s safety for all individuals. Your health professional or vaccine clinic may give you information about enrolling in the v-safe after vaccination health checker (see the box below).Even after you’re fully vaccinated, it is important to follow the CDC’s guidance for wearing a mask indoors in areas where there are substantial or high rates of COVID-19 infection.  ° °What Happens When You Enroll in v-Safe?  °The v-safe after vaccination health checker program lets the CDC check in with you after  your vaccination. At sign-up, you can indicate that you are pregnant. Once you do that, expect the following: °• Someone may call you from the v-safe program to ask initial questions and get more information. °• You may be asked to enroll in the vaccine pregnancy registry, which is collecting information about any effects of the vaccine during pregnancy. This is a great way to help scientists monitor the vaccine’s safety and effectiveness.  ° °References °1. Oliver SE, Gargano JW, Marin M, Wallace M, Curran KG, Chamberland M, et al. The Advisory Committee on Immunization Practices’ Interim Recommendation for Use of Pfizer-BioNTech  °COVID-19 Vaccine -- [place], December 2020. MMWR Morbidity and Mortality Weekly Report 2020;69. °2. FDA Briefing Document. Janssen Ad26.COV2.S Vaccine for the Prevention of COVID-19. 2021. Accessed Dec 21, 2019; Available from: https://www.fda.gov/media/146217/download °3. PFIZER-BIONTECH COVID-19 VACCINE [package insert] [place]: Pfizer and [place], [place]: Biontech;2020. °4. FDA Briefing Document. Moderna COVID-19 Vaccine. 2020. Accessed 2020, Dec 18; Available from: https://www.fda.gov/media/144434/download  °5. Gray KJ, Bordt EA, Atyeo C, Deriso E, Akinwunmi B, Young N, et al. COVID-19 vaccine response in pregnant and lactating women: a cohort study. Am J Obstet Gynecol 2021 Mar 24. °6. Panagiotakopoulos L, Myers TR, Gee J, Lipkind HS, Kharbanda EO, Ryan DS, et al. SARS-CoV-2 Infection Among Hospitalized Pregnant Women: Reasons for Admission and Pregnancy  °Characteristics - [facility], March 1-Mar 17, 2019. MMWR Morb Mortal Wkly Rep 2020 Sep 23;69(38):1355-9. °7. Zambrano LD, Ellington S, Strid P, Galang RR, Oduyebo T, Tong VT, et al. Update: Characteristics of Symptomatic Women of Reproductive Age with Laboratory-Confirmed SARSCoV-2 Infection by Pregnancy Status - [place], January 22-July 21, 2019. MMWR Morb Mortal Wkly Rep 2020 Nov  6;69(44):1641-7. °8. Delahoy MJ, Whitaker M, O'Halloran A, Chai SJ, Kirley PD, Alden N, et al. Characteristics and Maternal and Birth Outcomes of Hospitalized Pregnant Women with Laboratory-Confirmed COVID-19 - COVID-NET, 13 [place], March 1-June 09, 2019. MMWR Morb Mortal Wkly Rep 2020 Sep 25;69(38):1347-54. ° °

## 2020-06-11 NOTE — Progress Notes (Signed)
   PRENATAL VISIT NOTE  Subjective:  Catherine Holder is a 29 y.o. G1P0 at [redacted]w[redacted]d being seen today for ongoing prenatal care.  She is currently monitored for the following issues for this low-risk pregnancy and has Encounter for supervision of normal first pregnancy; Anxiety associated with birthing process; and Nystagmus on their problem list.  Patient reports no complaints.  Contractions: Irregular. Vag. Bleeding: None.  Movement: Present. Denies leaking of fluid.   The following portions of the patient's history were reviewed and updated as appropriate: allergies, current medications, past family history, past medical history, past social history, past surgical history and problem list.   Objective:   Vitals:   06/11/20 1040  BP: 134/83  Pulse: 86  Weight: 191 lb 3.2 oz (86.7 kg)    Fetal Status: Fetal Heart Rate (bpm): 135 Fundal Height: 38 cm Movement: Present     General:  Alert, oriented and cooperative. Patient is in no acute distress.  Skin: Skin is warm and dry. No rash noted.   Cardiovascular: Normal heart rate noted  Respiratory: Normal respiratory effort, no problems with respiration noted  Abdomen: Soft, gravid, appropriate for gestational age.  Pain/Pressure: Present     Pelvic: Cervical exam performed in the presence of a chaperone Dilation: Fingertip   Station: -1  Extremities: Normal range of motion.  Edema: Mild pitting, slight indentation  Mental Status: Normal mood and affect. Normal behavior. Normal judgment and thought content.   Assessment and Plan:  Pregnancy: G1P0 at [redacted]w[redacted]d 1. [redacted] weeks gestation of pregnancy   2. Supervision of other normal pregnancy, antepartum   Term labor symptoms and general obstetric precautions including but not limited to vaginal bleeding, contractions, leaking of fluid and fetal movement were reviewed in detail with the patient. Please refer to After Visit Summary for other counseling recommendations.   Return in about 1 week  (around 06/18/2020).  No future appointments.  Thressa Sheller DNP, CNM  06/11/20  11:19 AM

## 2020-06-18 ENCOUNTER — Encounter: Payer: Self-pay | Admitting: Women's Health

## 2020-06-18 ENCOUNTER — Telehealth (INDEPENDENT_AMBULATORY_CARE_PROVIDER_SITE_OTHER): Payer: Medicaid Other | Admitting: Women's Health

## 2020-06-18 DIAGNOSIS — Z3403 Encounter for supervision of normal first pregnancy, third trimester: Secondary | ICD-10-CM

## 2020-06-18 DIAGNOSIS — Z3A39 39 weeks gestation of pregnancy: Secondary | ICD-10-CM

## 2020-06-18 NOTE — Progress Notes (Signed)
I connected with Catherine Holder 06/18/20 at  1:00 PM EDT by: MyChart video and verified that I am speaking with the correct person using two identifiers.  Patient is located at home and provider is located at [facility].     The purpose of this virtual visit is to provide medical care while limiting exposure to the novel coronavirus. I discussed the limitations, risks, security and privacy concerns of performing an evaluation and management service by MyChart video and the availability of in person appointments. I also discussed with the patient that there may be a patient responsible charge related to this service. By engaging in this virtual visit, you consent to the provision of healthcare.  Additionally, you authorize for your insurance to be billed for the services provided during this visit.  The patient expressed understanding and agreed to proceed.  The following staff members participated in the virtual visit:  Donia Ast, NP    PRENATAL VISIT NOTE  Subjective:  Catherine Holder is a 29 y.o. G1P0 at [redacted]w[redacted]d  for phone visit for ongoing prenatal care.  She is currently monitored for the following issues for this low-risk pregnancy and has Encounter for supervision of normal first pregnancy; Anxiety associated with birthing process; and Nystagmus on their problem list.  Patient reports no complaints.  Contractions: Not present. Vag. Bleeding: None.  Movement: Present. Denies leaking of fluid.   The following portions of the patient's history were reviewed and updated as appropriate: allergies, current medications, past family history, past medical history, past social history, past surgical history and problem list.   Objective:   Vitals:   06/18/20 1312  BP: 99/61  Pulse: 83   Self-Obtained  Fetal Status:     Movement: Present     Assessment and Plan:  Pregnancy: G1P0 at [redacted]w[redacted]d  1. Encounter for supervision of normal first pregnancy in third trimester -anticipatory guidance  given on upcoming visits - NSTx2 next week -IOL scheduled for 41 weeks on 07/01/2020, orders entered -foley bulb to be considered PRN   Term labor symptoms and general obstetric precautions including but not limited to vaginal bleeding, contractions, leaking of fluid and fetal movement were reviewed in detail with the patient. I discussed the assessment and treatment plan with the patient. The patient was provided an opportunity to ask questions and all were answered. The patient agreed with the plan and demonstrated an understanding of the instructions. The patient was advised to call back or seek an in-person office evaluation/go to [facility] at [facility] for any urgent or concerning symptoms.  Return in 6 days (on 06/24/2020) for NST/OB, also needs NST/OB on Friday 09/10.  Future Appointments  Date Time Provider Department Center  06/25/2020  1:45 PM Constant, Gigi Gin, MD [facility] None     Time spent on virtual visit: 8 minutes  Marylen Ponto, NP

## 2020-06-18 NOTE — Patient Instructions (Signed)
Maternity Assessment Unit (MAU)  The Maternity Assessment Unit (MAU) is located at the [facility]. The address is: [address], [address], [place], [place] [address]. Please see map below for additional directions.    The Maternity Assessment Unit is designed to help you during your pregnancy, and for up to 6 weeks after delivery, with any pregnancy- or postpartum-related emergencies, if you think you are in labor, or if your water has broken. For example, if you experience nausea and vomiting, vaginal bleeding, severe abdominal or pelvic pain, elevated blood pressure or other problems related to your pregnancy or postpartum time, please come to the Maternity Assessment Unit for assistance.        Signs and Symptoms of Labor Labor is your body's natural process of moving your baby, placenta, and umbilical cord out of your uterus. The process of labor usually starts when your baby is full-term, between 37 and 40 weeks of pregnancy. How will I know when I am close to going into labor? As your body prepares for labor and the birth of your baby, you may notice the following symptoms in the weeks and days before true labor starts:  Having a strong desire to get your home ready to receive your new baby. This is called nesting. Nesting may be a sign that labor is approaching, and it may occur several weeks before birth. Nesting may involve cleaning and organizing your home.  Passing a small amount of thick, bloody mucus out of your vagina (normal bloody show or losing your mucus plug). This may happen more than a week before labor begins, or it might occur right before labor begins as the opening of the cervix starts to widen (dilate). For some women, the entire mucus plug passes at once. For others, smaller portions of the mucus plug may gradually pass over several days.  Your baby moving (dropping) lower in your pelvis to get into position for birth  (lightening). When this happens, you may feel more pressure on your bladder and pelvic bone and less pressure on your ribs. This may make it easier to breathe. It may also cause you to need to urinate more often and have problems with bowel movements.  Having "practice contractions" (Braxton Hicks contractions) that occur at irregular (unevenly spaced) intervals that are more than 10 minutes apart. This is also called false labor. False labor contractions are common after exercise or sexual activity, and they will stop if you change position, rest, or drink fluids. These contractions are usually mild and do not get stronger over time. They may feel like: ? A backache or back pain. ? Mild cramps, similar to menstrual cramps. ? Tightening or pressure in your abdomen. Other early symptoms that labor may be starting soon include:  Nausea or loss of appetite.  Diarrhea.  Having a sudden burst of energy, or feeling very tired.  Mood changes.  Having trouble sleeping. How will I know when labor has begun? Signs that true labor has begun may include:  Having contractions that come at regular (evenly spaced) intervals and increase in intensity. This may feel like more intense tightening or pressure in your abdomen that moves to your back. ? Contractions may also feel like rhythmic pain in your upper thighs or back that comes and goes at regular intervals. ? For first-time mothers, this change in intensity of contractions often occurs at a more gradual pace. ? Women who have given birth before may notice   a more rapid progression of contraction changes.  Having a feeling of pressure in the vaginal area.  Your water breaking (rupture of membranes). This is when the sac of fluid that surrounds your baby breaks. When this happens, you will notice fluid leaking from your vagina. This may be clear or blood-tinged. Labor usually starts within 24 hours of your water breaking, but it may take longer to  begin. ? Some women notice this as a gush of fluid. ? Others notice that their underwear repeatedly becomes damp. Follow these instructions at home:   When labor starts, or if your water breaks, call your health care provider or nurse care line. Based on your situation, they will determine when you should go in for an exam.  When you are in early labor, you may be able to rest and manage symptoms at home. Some strategies to try at home include: ? Breathing and relaxation techniques. ? Taking a warm bath or shower. ? Listening to music. ? Using a heating pad on the lower back for pain. If you are directed to use heat:  Place a towel between your skin and the heat source.  Leave the heat on for 20-30 minutes.  Remove the heat if your skin turns bright red. This is especially important if you are unable to feel pain, heat, or cold. You may have a greater risk of getting burned. Get help right away if:  You have painful, regular contractions that are 5 minutes apart or less.  Labor starts before you are [redacted] weeks along in your pregnancy.  You have a fever.  You have a headache that does not go away.  You have bright red blood coming from your vagina.  You do not feel your baby moving.  You have a sudden onset of: ? Severe headache with vision problems. ? Nausea, vomiting, or diarrhea. ? Chest pain or shortness of breath. These symptoms may be an emergency. If your health care provider recommends that you go to the hospital or birth center where you plan to deliver, do not drive yourself. Have someone else drive you, or call emergency services (911 in the [place] Summary  Labor is your body's natural process of moving your baby, placenta, and umbilical cord out of your uterus.  The process of labor usually starts when your baby is full-term, between 59 and 40 weeks of pregnancy.  When labor starts, or if your water breaks, call your health care provider or nurse care line. Based  on your situation, they will determine when you should go in for an exam. This information is not intended to replace advice given to you by your health care provider. Make sure you discuss any questions you have with your health care provider. Document Revised: 07/04/2017 Document Reviewed: 03/11/2017 Elsevier Patient Education  2020 Elsevier Inc.        Nonstress Test A nonstress test is a procedure that is done during pregnancy in order to check the baby's heartbeat. The procedure can help show if the baby (fetus) is healthy. It is commonly done when:  The baby is past his or her due date.  The pregnancy is high risk.  The baby is moving less than normal.  The mother has lost a pregnancy in the past.  The health care provider suspects a problem with the baby's growth.  There is too much or too little amniotic fluid. The procedure is often done in the third trimester of pregnancy to find out if  an early delivery is needed and whether such a delivery is safe. During a nonstress test, the baby's heartbeat is monitored when the baby is resting and when the baby is moving. If the baby is healthy, the heart rate will increase when he or she moves or kicks and will return to normal when he or she rests. Tell a health care provider about:  Any allergies you have.  Any medical conditions you have.  All medicines you are taking, including vitamins, herbs, eye drops, creams, and over-the-counter medicines. What are the risks? There are no risks to you or your baby from a nonstress test. This procedure should not be painful or uncomfortable. What happens before the procedure?  Eat a meal right before the test or as directed by your health care provider. Food may help encourage the baby to move.  Use the restroom right before the test. What happens during the procedure?  Two monitors will be placed on your abdomen. One will record the baby's heart rate and the other will record the  contractions of your uterus.  You may be asked to lie down on your side or to sit upright.  You may be given a button to press when you feel your baby move.  Your health care provider will listen to your baby's heartbeat and recorded it. He or she may also watch your baby's heartbeat on a screen.  If the baby seems to be sleeping, you may be asked to drink some juice or soda, eat a snack, or change positions. The procedure may vary among health care providers and hospitals. What happens after the procedure?  Your health care provider will discuss the test results with you and make recommendations for the future. Depending on the results, your health care provider may order additional tests or another course of action.  If your health care provider gave you any diet or activity instructions, make sure to follow them.  Keep all follow-up visits as told by your health care provider. This is important. Summary  A nonstress test is a procedure that is done during pregnancy in order to check the baby's heartbeat. The procedure can help show if the baby is healthy.  The procedure is often done in the third trimester of pregnancy to find out if an early delivery is needed and whether such a delivery is safe.  During a nonstress test, the baby's heartbeat is monitored when the baby is resting and when the baby is moving. If the baby is healthy, the heart rate will increase when he or she moves or kicks and will return to normal when he or she rests.  Your health care provider will discuss the test results with you and make recommendations for the future. This information is not intended to replace advice given to you by your health care provider. Make sure you discuss any questions you have with your health care provider. Document Revised: 01/13/2017 Document Reviewed: 01/13/2017 Elsevier Patient Education  2020 [employer]        Labor Induction  Labor induction is when steps are  taken to cause a pregnant woman to begin the labor process. Most women go into labor on their own between 37 weeks and 42 weeks of pregnancy. When this does not happen or when there is a medical need for labor to begin, steps may be taken to induce labor. Labor induction causes a pregnant woman's uterus to contract. It also causes the cervix to soften (ripen), open (dilate),  and thin out (efface). Usually, labor is not induced before 39 weeks of pregnancy unless there is a medical reason to do so. Your health care provider will determine if labor induction is needed. Before inducing labor, your health care provider will consider a number of factors, including:  Your medical condition and your baby's.  How many weeks along you are in your pregnancy.  How mature your baby's lungs are.  The condition of your cervix.  The position of your baby.  The size of your birth canal. What are some reasons for labor induction? Labor may be induced if:  Your health or your baby's health is at risk.  Your pregnancy is overdue by 1 week or more.  Your water breaks but labor does not start on its own.  There is a low amount of amniotic fluid around your baby. You may also choose (elect) to have labor induced at a certain time. Generally, elective labor induction is done no earlier than 39 weeks of pregnancy. What methods are used for labor induction? Methods used for labor induction include:  Prostaglandin medicine. This medicine starts contractions and causes the cervix to dilate and ripen. It can be taken by mouth (orally) or by being inserted into the vagina (suppository).  Inserting a small, thin tube (catheter) with a balloon into the vagina and then expanding the balloon with water to dilate the cervix.  Stripping the membranes. In this method, your health care provider gently separates amniotic sac tissue from the cervix. This causes the cervix to stretch, which in turn causes the release of a  hormone called progesterone. The hormone causes the uterus to contract. This procedure is often done during an office visit, after which you will be sent home to wait for contractions to begin.  Breaking the water. In this method, your health care provider uses a small instrument to make a small hole in the amniotic sac. This eventually causes the amniotic sac to break. Contractions should begin after a few hours.  Medicine to trigger or strengthen contractions. This medicine is given through an IV that is inserted into a vein in your arm. Except for membrane stripping, which can be done in a clinic, labor induction is done in the hospital so that you and your baby can be carefully monitored. How long does it take for labor to be induced? The length of time it takes to induce labor depends on how ready your body is for labor. Some inductions can take up to 2-3 days, while others may take less than a day. Induction may take longer if:  You are induced early in your pregnancy.  It is your first pregnancy.  Your cervix is not ready. What are some risks associated with labor induction? Some risks associated with labor induction include:  Changes in fetal heart rate, such as being too high, too low, or irregular (erratic).  Failed induction.  Infection in the mother or the baby.  Increased risk of having a cesarean delivery.  Fetal death.  Breaking off (abruption) of the placenta from the uterus (rare).  Rupture of the uterus (very rare). When induction is needed for medical reasons, the benefits of induction generally outweigh the risks. What are some reasons for not inducing labor? Labor induction should not be done if:  Your baby does not tolerate contractions.  You have had previous surgeries on your uterus, such as a myomectomy, removal of fibroids, or a vertical scar from a previous cesarean delivery.  Your  placenta lies very low in your uterus and blocks the opening of the  cervix (placenta previa).  Your baby is not in a head-down position.  The umbilical cord drops down into the birth canal in front of the baby.  There are unusual circumstances, such as the baby being very early (premature).  You have had more than 2 previous cesarean deliveries. Summary  Labor induction is when steps are taken to cause a pregnant woman to begin the labor process.  Labor induction causes a pregnant woman's uterus to contract. It also causes the cervix to ripen, dilate, and efface.  Labor is not induced before 39 weeks of pregnancy unless there is a medical reason to do so.  When induction is needed for medical reasons, the benefits of induction generally outweigh the risks. This information is not intended to replace advice given to you by your health care provider. Make sure you discuss any questions you have with your health care provider. Document Revised: 10/07/2017 Document Reviewed: 11/17/2016 Elsevier Patient Education  2020 [employer].       New Induction of Labor Process for [employer] [facility]  In [place] 2020 [facility] and [facility] changed it's process for scheduling inductions of labor to create more induction slots and to make sure patients get COVID-19 testing in advance. After you have been tested you need to quarantine so that you do not get infected after your test. You should not go anywhere after your test except necessary medical appointments.  You have been scheduled for induction of labor on 07/01/2020. Although you may have a specific time listed on your After Visit Summary or MyChart, we cannot predict when your room will be available. Please disregard this time. A Labor and Delivery staff member will call you on the day that you are scheduled when your room is available. You will need to arrive within one hour of being called. If you do not arrive within this time frame, the next person on the list will  be called in and you will move down the list. You may eat a light meal before coming to the hospital. If you go into labor, think your water has broken, experience bright red bleeding or don't feel your baby moving as much as usual before your induction, please call your Ob/Gyn's office or come to Entrance C, Maternity Assessment Unit for evaluation.  Thank you,  [facility] [employer]

## 2020-06-23 ENCOUNTER — Other Ambulatory Visit: Payer: Self-pay | Admitting: Advanced Practice Midwife

## 2020-06-25 ENCOUNTER — Other Ambulatory Visit: Payer: Self-pay

## 2020-06-25 ENCOUNTER — Inpatient Hospital Stay (HOSPITAL_COMMUNITY)
Admission: AD | Admit: 2020-06-25 | Discharge: 2020-06-28 | DRG: 807 | Disposition: A | Discharge status: Home / Self Care | Payer: Medicaid Other | Attending: Obstetrics & Gynecology | Admitting: Obstetrics & Gynecology

## 2020-06-25 ENCOUNTER — Encounter: Payer: Self-pay | Admitting: Obstetrics and Gynecology

## 2020-06-25 ENCOUNTER — Encounter (HOSPITAL_COMMUNITY): Payer: Self-pay | Admitting: Obstetrics & Gynecology

## 2020-06-25 ENCOUNTER — Ambulatory Visit (INDEPENDENT_AMBULATORY_CARE_PROVIDER_SITE_OTHER): Payer: Medicaid Other | Admitting: Obstetrics and Gynecology

## 2020-06-25 VITALS — BP 134/81 | HR 83 | Wt 192.5 lb

## 2020-06-25 DIAGNOSIS — Z3A40 40 weeks gestation of pregnancy: Secondary | ICD-10-CM | POA: Diagnosis not present

## 2020-06-25 DIAGNOSIS — Z20822 Contact with and (suspected) exposure to covid-19: Secondary | ICD-10-CM | POA: Diagnosis present

## 2020-06-25 DIAGNOSIS — Z3403 Encounter for supervision of normal first pregnancy, third trimester: Secondary | ICD-10-CM | POA: Diagnosis not present

## 2020-06-25 DIAGNOSIS — O2242 Hemorrhoids in pregnancy, second trimester: Secondary | ICD-10-CM

## 2020-06-25 DIAGNOSIS — O480 Post-term pregnancy: Secondary | ICD-10-CM | POA: Diagnosis not present

## 2020-06-25 DIAGNOSIS — O368190 Decreased fetal movements, unspecified trimester, not applicable or unspecified: Secondary | ICD-10-CM | POA: Diagnosis present

## 2020-06-25 DIAGNOSIS — O368130 Decreased fetal movements, third trimester, not applicable or unspecified: Principal | ICD-10-CM | POA: Diagnosis present

## 2020-06-25 LAB — CBC
HCT: 34.3 % — ABNORMAL LOW (ref 36.0–46.0)
Hemoglobin: 11 g/dL — ABNORMAL LOW (ref 12.0–15.0)
MCH: 29.6 pg (ref 26.0–34.0)
MCHC: 32.1 g/dL (ref 30.0–36.0)
MCV: 92.2 fL (ref 80.0–100.0)
Platelets: 177 10*3/uL (ref 150–400)
RBC: 3.72 MIL/uL — ABNORMAL LOW (ref 3.87–5.11)
RDW: 13.1 % (ref 11.5–15.5)
WBC: 6.9 10*3/uL (ref 4.0–10.5)
nRBC: 0 % (ref 0.0–0.2)

## 2020-06-25 LAB — TYPE AND SCREEN
ABO/RH(D): A POS
Antibody Screen: NEGATIVE

## 2020-06-25 LAB — SARS CORONAVIRUS 2 BY RT PCR (HOSPITAL ORDER, PERFORMED IN ~~LOC~~ HOSPITAL LAB): SARS Coronavirus 2: NEGATIVE

## 2020-06-25 MED ORDER — DIPHENHYDRAMINE HCL 50 MG/ML IJ SOLN: 12.5000 mg | INTRAMUSCULAR | Status: DC | PRN

## 2020-06-25 MED ORDER — OXYCODONE-ACETAMINOPHEN 5-325 MG PO TABS: 2.0000 | ORAL_TABLET | ORAL | Status: DC | PRN

## 2020-06-25 MED ORDER — TERBUTALINE SULFATE 1 MG/ML IJ SOLN: 0.2500 mg | Freq: Once | INTRAMUSCULAR | Status: DC | PRN

## 2020-06-25 MED ORDER — LACTATED RINGERS IV SOLN: 500.0000 mL | INTRAVENOUS | Status: DC | PRN

## 2020-06-25 MED ORDER — OXYCODONE-ACETAMINOPHEN 5-325 MG PO TABS: 1.0000 | ORAL_TABLET | ORAL | Status: DC | PRN

## 2020-06-25 MED ORDER — OXYTOCIN-SODIUM CHLORIDE 30-0.9 UT/500ML-% IV SOLN: 2.5000 [IU]/h | INTRAVENOUS | Status: DC

## 2020-06-25 MED ORDER — SOD CITRATE-CITRIC ACID 500-334 MG/5ML PO SOLN: 30.0000 mL | ORAL | Status: DC | PRN

## 2020-06-25 MED ORDER — OXYTOCIN BOLUS FROM INFUSION
333.0000 mL | Freq: Once | INTRAVENOUS | Status: AC
  Administered 2020-06-26: 333 mL via INTRAVENOUS

## 2020-06-25 MED ORDER — FENTANYL CITRATE (PF) 100 MCG/2ML IJ SOLN
100.0000 ug | INTRAMUSCULAR | Status: DC | PRN
  Administered 2020-06-25 – 2020-06-26 (×2): 100 ug via INTRAVENOUS
  Filled 2020-06-25 (×2): qty 2

## 2020-06-25 MED ORDER — PHENYLEPHRINE 40 MCG/ML (10ML) SYRINGE FOR IV PUSH (FOR BLOOD PRESSURE SUPPORT)
80.0000 ug | PREFILLED_SYRINGE | INTRAVENOUS | Status: DC | PRN
  Administered 2020-06-26: 80 ug via INTRAVENOUS

## 2020-06-25 MED ORDER — ONDANSETRON HCL 4 MG/2ML IJ SOLN: 4.0000 mg | Freq: Four times a day (QID) | INTRAMUSCULAR | Status: DC | PRN

## 2020-06-25 MED ORDER — PHENYLEPHRINE 40 MCG/ML (10ML) SYRINGE FOR IV PUSH (FOR BLOOD PRESSURE SUPPORT)
80.0000 ug | PREFILLED_SYRINGE | INTRAVENOUS | Status: DC | PRN
  Filled 2020-06-25: qty 10

## 2020-06-25 MED ORDER — LIDOCAINE HCL (PF) 1 % IJ SOLN: 30.0000 mL | INTRAMUSCULAR | Status: DC | PRN

## 2020-06-25 MED ORDER — LACTATED RINGERS IV SOLN: INTRAVENOUS | Status: DC

## 2020-06-25 MED ORDER — EPHEDRINE 5 MG/ML INJ: 10.0000 mg | INTRAVENOUS | Status: DC | PRN

## 2020-06-25 MED ORDER — FENTANYL-BUPIVACAINE-NACL 0.5-0.125-0.9 MG/250ML-% EP SOLN
12.0000 mL/h | EPIDURAL | Status: DC | PRN
  Filled 2020-06-25: qty 250

## 2020-06-25 MED ORDER — LACTATED RINGERS IV SOLN
500.0000 mL | Freq: Once | INTRAVENOUS | Status: AC
  Administered 2020-06-26: 500 mL via INTRAVENOUS

## 2020-06-25 MED ORDER — ACETAMINOPHEN 325 MG PO TABS: 650.0000 mg | ORAL_TABLET | ORAL | Status: DC | PRN

## 2020-06-25 MED ORDER — MISOPROSTOL 50MCG HALF TABLET
50.0000 ug | ORAL_TABLET | ORAL | Status: DC
  Administered 2020-06-25 (×2): 50 ug via BUCCAL
  Filled 2020-06-25 (×2): qty 1

## 2020-06-25 NOTE — MAU Note (Signed)
. °  Catherine Holder is a 29 y.o. at [redacted]w[redacted]d here in MAU reporting: she went to the office today for routine PNV and told them she was having decrease fetal movement and was told to come in and be evaluated. PT denies any VB or LOF  Onset of complaint:a week  Pain score: 0 Vitals:   06/25/20 1550  BP: 137/87  Pulse: 78  Resp: 16  Temp: 98.3 F (36.8 C)  SpO2: 100%     FHT:140 Lab orders placed from triage:

## 2020-06-25 NOTE — Progress Notes (Signed)
Sent from office for NRNST. Pt reports no FM since last night. NST here reassuring, not reactive yet. Recommend admit and IOL. Dr. Earlene Plater notified.

## 2020-06-25 NOTE — Progress Notes (Signed)
Pt is here for ROB/NST, [redacted]w[redacted]d. Induction scheduled for 9/14, pt is aware.

## 2020-06-25 NOTE — Progress Notes (Signed)
Labor Progress Note Catherine Holder is a 29 y.o. G1P0 at [redacted]w[redacted]d presented for IOL for decreased fetal movement.  S: Nervous about balloon placement.   O:  BP 103/66    Pulse 68    Temp 99.3 F (37.4 C)    Resp 18    Ht 5\' 3"  (1.6 m)    Wt 87.3 kg    LMP 09/18/2019    SpO2 100%    BMI 34.09 kg/m  EFM: 150/mod variability/pos accels/ one single decel while patient sitting at bedside, improved w repositioning   CVE: Dilation: 2 Effacement (%): 50 Cervical Position: Posterior Station: -2 Presentation: Vertex Exam by:: Kedric Bumgarner, MD   A&P: 29 y.o. G1P0 [redacted]w[redacted]d here for IOL for decreased FM #Induction of Labor: Progressing well. S/p one dose cytotec, FB placed at 2200, will proceed w second dose of cytotec. Recheck in four hours and likely transition to pitocin.  #Pain: epidural, pain meds prn #FWB: cat I  #GBS negative   [redacted]w[redacted]d, MD 11:41 PM

## 2020-06-25 NOTE — Progress Notes (Signed)
   PRENATAL VISIT NOTE  Subjective:  Catherine Holder is a 29 y.o. G1P0 at [redacted]w[redacted]d being seen today for ongoing prenatal care.  She is currently monitored for the following issues for this low-risk pregnancy and has Encounter for supervision of normal first pregnancy; Anxiety associated with birthing process; and Nystagmus on their problem list.  Patient reports no complaints.  Contractions: Irregular. Vag. Bleeding: None.  Movement: Present. Denies leaking of fluid.   The following portions of the patient's history were reviewed and updated as appropriate: allergies, current medications, past family history, past medical history, past social history, past surgical history and problem list.   Objective:   Vitals:   06/25/20 1344  BP: 134/81  Pulse: 83  Weight: 192 lb 8 oz (87.3 kg)    Fetal Status: Fetal Heart Rate (bpm): NST Fundal Height: 40 cm Movement: Present     General:  Alert, oriented and cooperative. Patient is in no acute distress.  Skin: Skin is warm and dry. No rash noted.   Cardiovascular: Normal heart rate noted  Respiratory: Normal respiratory effort, no problems with respiration noted  Abdomen: Soft, gravid, appropriate for gestational age.  Pain/Pressure: Present     Pelvic: Cervical exam performed in the presence of a chaperone Dilation: Fingertip Effacement (%): Thick Station: -2  Extremities: Normal range of motion.  Edema: Trace  Mental Status: Normal mood and affect. Normal behavior. Normal judgment and thought content.   Assessment and Plan:  Pregnancy: G1P0 at [redacted]w[redacted]d 1. Encounter for supervision of normal first pregnancy in third trimester Patient is doing well without complaints NST reviewed and non reactive baseline 140, mod variability. 1 accels, no decels Toco: no contractions Patient sent to MAU for further evaluation and possible induction of labor  Term labor symptoms and general obstetric precautions including but not limited to vaginal bleeding,  contractions, leaking of fluid and fetal movement were reviewed in detail with the patient. Please refer to After Visit Summary for other counseling recommendations.   No follow-ups on file.  Future Appointments  Date Time Provider Department Center  06/27/2020  9:45 AM Brock Bad, MD [facility] None  06/30/2020  9:20 AM [facility] None  07/01/2020  7:15 AM [facility] SCHED ROOM [facility] None    Catalina Antigua, MD

## 2020-06-25 NOTE — H&P (Addendum)
OBSTETRIC ADMISSION HISTORY AND PHYSICAL  Catherine Holder is a 29 y.o. female G1P0 with IUP at [redacted]w[redacted]d by LMP presenting for decreased fetal movement. She reports +FMs, No LOF, no VB, no blurry vision, headaches or peripheral edema, and RUQ pain.  She plans on breast and bottle feeding. She is unsure for birth control. She received her prenatal care at  [place]: By LMP --->  Estimated Date of Delivery: 06/24/20  Sono:    '@[redacted]w[redacted]d'$ , CWD, normal anatomy, cephalic presentation, posterior palcenta, 581g, 57% EFW   Prenatal History/Complications:  None  Past Medical History: History reviewed. No pertinent past medical history.  Past Surgical History: History reviewed. No pertinent surgical history.  Obstetrical History: OB History     Gravida  1   Para      Term      Preterm      AB      Living         SAB      TAB      Ectopic      Multiple      Live Births              Social History Social History   Socioeconomic History  . Marital status: Married    Spouse name: Not on file  . Number of children: Not on file  . Years of education: Not on file  . Highest education level: Not on file  Occupational History  . Not on file  Tobacco Use  . Smoking status: Never Smoker  . Smokeless tobacco: Never Used  Vaping Use  . Vaping Use: Never used  Substance and Sexual Activity  . Alcohol use: Never  . Drug use: Never  . Sexual activity: Yes    Birth control/protection: None  Other Topics Concern  . Not on file  Social History Narrative  . Not on file   Social Determinants of Health   Financial Resource Strain:   . Difficulty of Paying Living Expenses: Not on file  Food Insecurity:   . Worried About Charity fundraiser in the Last Year: Not on file  . Ran Out of Food in the Last Year: Not on file  Transportation Needs:   . Lack of Transportation (Medical): Not on file  . Lack of Transportation (Non-Medical): Not on file  Physical Activity:    . Days of Exercise per Week: Not on file  . Minutes of Exercise per Session: Not on file  Stress:   . Feeling of Stress : Not on file  Social Connections:   . Frequency of Communication with Friends and Family: Not on file  . Frequency of Social Gatherings with Friends and Family: Not on file  . Attends Religious Services: Not on file  . Active Member of Clubs or Organizations: Not on file  . Attends Archivist Meetings: Not on file  . Marital Status: Not on file    Family History: History reviewed. No pertinent family history.  Allergies: No Known Allergies  Medications Prior to Admission  Medication Sig Dispense Refill Last Dose  . Blood Pressure Monitoring (BLOOD PRESSURE KIT) DEVI 1 kit by Does not apply route once a week. Check Blood Pressure regularly and record readings into the Babyscripts App.  Large Cuff.  DX O90.0 1 each 0 06/25/2020 at Unknown time  . Prenat-Fe Poly-Methfol-FA-DHA (VITAFOL ULTRA) 29-0.6-0.4-200 MG CAPS Take 1 capsule by mouth daily before breakfast. 90 capsule 4 06/25/2020 at Unknown time  .  docusate sodium (COLACE) 100 MG capsule Take 1 capsule (100 mg total) by mouth 2 (two) times daily as needed. (Patient not taking: Reported on 06/25/2020) 30 capsule 2   . Prenatal Vit w/Fe-Methylfol-FA (PRENATE ELITE) 26-0.6-0.4 MG TABS Take 1 tablet by mouth daily. (Patient not taking: Reported on 02/28/2020) 30 tablet 11   . Prenatal Vit-Fe Phos-FA-Omega (VITAFOL GUMMIES) 3.33-0.333-34.8 MG CHEW Chew 3 each by mouth daily. (Patient not taking: Reported on 02/28/2020) 90 tablet 11      Review of Systems   All systems reviewed and negative except as stated in HPI  Blood pressure 137/87, pulse 78, temperature 98.3 F (36.8 C), resp. rate 16, height 5' 5" (1.651 m), last menstrual period 09/18/2019, SpO2 100 %. General appearance: alert, cooperative, appears stated age and no distress Lungs: clear to auscultation bilaterally Heart: regular rate and  rhythm Abdomen: soft, non-tender; bowel sounds normal Pelvic: deferred Extremities: No edema, no sign of DVT Presentation: cephalic Fetal monitoringBaseline: 130 bpm, Variability: Good {> 6 bpm), Accelerations: Reactive and Decelerations: Absent Uterine activityFrequency: Uterine irritability     Prenatal labs: ABO, Rh: A/Positive/-- (03/18 1027) Antibody: Negative (03/18 1027) Rubella: 8.49 (03/18 1027) RPR: Non Reactive (06/10 0929)  HBsAg: Negative (03/18 1027)  HIV: Non Reactive (06/10 0929)  GBS: Negative/-- (08/18 1123)  2 hr GTT: 80/109/116 Genetic screening: NIPS low risk female, AFP negative Anatomy US: Normal  Prenatal Transfer Tool  Maternal Diabetes: No Genetic Screening: Normal Maternal Ultrasounds/Referrals: Normal Fetal Ultrasounds or other Referrals:  None Maternal Substance Abuse:  No Significant Maternal Medications:  None Significant Maternal Lab Results: Group B Strep negative  No results found for this or any previous visit (from the past 24 hour(s)).  Patient Active Problem List   Diagnosis Date Noted  . Anxiety associated with birthing process 03/27/2020  . Nystagmus 03/27/2020  . Encounter for supervision of normal first pregnancy 12/27/2019    Assessment/Plan:  Catherine Holder is a 29 y.o. G1P0 at 71w1dhere for IOL for decreased fetal movement  #Labor: IOL for DFM at term. Cytotec x1 _0 . Will plan to reassess in 4h, could consider foley balloon at that time  #Pain: Per patient request; planning Epidural  #FWB: Cat I #ID:  GBS negative #MOF: Breast and bottle #MOC: Declines #Circ:  Yes, desires circ for infant  ASharion Settler DO  06/25/2020, 4:23 PM   OB FELLOW HISTORY AND PHYSICAL ATTESTATION  I have seen and examined this patient; I agree with above documentation in the resident's note.   CPhill Myron D.O. OB Fellow  06/25/2020, 7:09 PM

## 2020-06-26 ENCOUNTER — Inpatient Hospital Stay (HOSPITAL_COMMUNITY): Payer: Medicaid Other | Admitting: Anesthesiology

## 2020-06-26 ENCOUNTER — Encounter (HOSPITAL_COMMUNITY): Payer: Self-pay | Admitting: Internal Medicine

## 2020-06-26 DIAGNOSIS — O480 Post-term pregnancy: Secondary | ICD-10-CM

## 2020-06-26 DIAGNOSIS — Z3A40 40 weeks gestation of pregnancy: Secondary | ICD-10-CM

## 2020-06-26 DIAGNOSIS — O368130 Decreased fetal movements, third trimester, not applicable or unspecified: Secondary | ICD-10-CM

## 2020-06-26 LAB — RPR: RPR Ser Ql: NONREACTIVE

## 2020-06-26 MED ORDER — LIDOCAINE HCL (PF) 1 % IJ SOLN
INTRAMUSCULAR | Status: DC | PRN
  Administered 2020-06-26: 8 mL via EPIDURAL

## 2020-06-26 MED ORDER — DIBUCAINE (PERIANAL) 1 % EX OINT: 1.0000 "application " | TOPICAL_OINTMENT | CUTANEOUS | Status: DC | PRN

## 2020-06-26 MED ORDER — FENTANYL CITRATE (PF) 2500 MCG/50ML IJ SOLN
INTRAMUSCULAR | Status: DC | PRN
Start: 2020-06-26 — End: 2020-06-26
  Administered 2020-06-26: 12 mL/h via EPIDURAL

## 2020-06-26 MED ORDER — ONDANSETRON HCL 4 MG/2ML IJ SOLN: 4.0000 mg | INTRAMUSCULAR | Status: DC | PRN

## 2020-06-26 MED ORDER — SENNOSIDES-DOCUSATE SODIUM 8.6-50 MG PO TABS
2.0000 | ORAL_TABLET | ORAL | Status: DC
  Administered 2020-06-26 – 2020-06-27 (×2): 2 via ORAL
  Filled 2020-06-26 (×2): qty 2

## 2020-06-26 MED ORDER — COCONUT OIL OIL
1.0000 "application " | TOPICAL_OIL | Status: DC | PRN
  Administered 2020-06-28: 1 via TOPICAL

## 2020-06-26 MED ORDER — ONDANSETRON HCL 4 MG PO TABS: 4.0000 mg | ORAL_TABLET | ORAL | Status: DC | PRN

## 2020-06-26 MED ORDER — IBUPROFEN 600 MG PO TABS
600.0000 mg | ORAL_TABLET | Freq: Four times a day (QID) | ORAL | Status: DC
  Administered 2020-06-26 – 2020-06-28 (×9): 600 mg via ORAL
  Filled 2020-06-26 (×9): qty 1

## 2020-06-26 MED ORDER — BENZOCAINE-MENTHOL 20-0.5 % EX AERO
1.0000 "application " | INHALATION_SPRAY | CUTANEOUS | Status: DC | PRN
  Administered 2020-06-26: 1 via TOPICAL
  Filled 2020-06-26: qty 56

## 2020-06-26 MED ORDER — PRENATAL MULTIVITAMIN CH
1.0000 | ORAL_TABLET | Freq: Every day | ORAL | Status: DC
  Administered 2020-06-26 – 2020-06-28 (×3): 1 via ORAL
  Filled 2020-06-26 (×3): qty 1

## 2020-06-26 MED ORDER — SIMETHICONE 80 MG PO CHEW: 80.0000 mg | CHEWABLE_TABLET | ORAL | Status: DC | PRN

## 2020-06-26 MED ORDER — WITCH HAZEL-GLYCERIN EX PADS: 1.0000 "application " | MEDICATED_PAD | CUTANEOUS | Status: DC | PRN

## 2020-06-26 MED ORDER — ACETAMINOPHEN 325 MG PO TABS
650.0000 mg | ORAL_TABLET | ORAL | Status: DC | PRN
  Administered 2020-06-26: 650 mg via ORAL
  Filled 2020-06-26: qty 2

## 2020-06-26 MED ORDER — DOCUSATE SODIUM 100 MG PO CAPS
100.0000 mg | ORAL_CAPSULE | Freq: Two times a day (BID) | ORAL | Status: DC
  Administered 2020-06-26 – 2020-06-28 (×3): 100 mg via ORAL
  Filled 2020-06-26 (×4): qty 1

## 2020-06-26 MED ORDER — TETANUS-DIPHTH-ACELL PERTUSSIS 5-2.5-18.5 LF-MCG/0.5 IM SUSP: 0.5000 mL | Freq: Once | INTRAMUSCULAR | Status: DC

## 2020-06-26 MED ORDER — OXYTOCIN-SODIUM CHLORIDE 30-0.9 UT/500ML-% IV SOLN
1.0000 m[IU]/min | INTRAVENOUS | Status: DC
  Administered 2020-06-26: 2 m[IU]/min via INTRAVENOUS
  Filled 2020-06-26: qty 500

## 2020-06-26 MED ORDER — TERBUTALINE SULFATE 1 MG/ML IJ SOLN: 0.2500 mg | Freq: Once | INTRAMUSCULAR | Status: DC | PRN

## 2020-06-26 MED ORDER — DIPHENHYDRAMINE HCL 25 MG PO CAPS: 25.0000 mg | ORAL_CAPSULE | Freq: Four times a day (QID) | ORAL | Status: DC | PRN

## 2020-06-26 NOTE — Anesthesia Procedure Notes (Signed)
Epidural Patient location during procedure: OB Start time: 06/26/2020 2:06 AM End time: 06/26/2020 2:15 AM  Staffing Anesthesiologist: Mellody Dance, MD Performed: anesthesiologist   Preanesthetic Checklist Completed: patient identified, IV checked, site marked, risks and benefits discussed, monitors and equipment checked, pre-op evaluation and timeout performed  Epidural Patient position: sitting Prep: DuraPrep Patient monitoring: heart rate, cardiac monitor, continuous pulse ox and blood pressure Approach: midline Location: L3-L4 Injection technique: LOR saline  Needle:  Needle type: Tuohy  Needle gauge: 17 G Needle length: 9 cm Needle insertion depth: 5 cm Catheter type: closed end flexible Catheter size: 20 Guage Catheter at skin depth: 10 cm Test dose: negative and Other  Assessment Events: blood not aspirated, injection not painful, no injection resistance and negative IV test  Additional Notes Informed consent obtained prior to proceeding including risk of failure, 1% risk of PDPH, risk of minor discomfort and bruising.  Discussed rare but serious complications including epidural abscess, permanent nerve injury, epidural hematoma.  Discussed alternatives to epidural analgesia and patient desires to proceed.  Timeout performed pre-procedure verifying patient name, procedure, and platelet count.  Patient tolerated procedure well.

## 2020-06-26 NOTE — Anesthesia Postprocedure Evaluation (Signed)
Anesthesia Post Note  Patient: Catherine Holder  Procedure(s) Performed: AN AD HOC LABOR EPIDURAL     Patient location during evaluation: Mother Baby Anesthesia Type: Epidural Level of consciousness: awake and alert Pain management: pain level controlled Vital Signs Assessment: post-procedure vital signs reviewed and stable Respiratory status: spontaneous breathing, nonlabored ventilation and respiratory function stable Cardiovascular status: stable Postop Assessment: no headache, no backache and epidural receding Anesthetic complications: no   No complications documented.  Last Vitals:  Vitals:   06/26/20 0908 06/26/20 1001  BP: 121/72 116/71  Pulse: 67 70  Resp: 18 17  Temp: 37 C 37 C  SpO2:      Last Pain:  Vitals:   06/26/20 1001  TempSrc: Oral  PainSc:    Pain Goal:                   Georga Stys

## 2020-06-26 NOTE — Anesthesia Preprocedure Evaluation (Signed)
Anesthesia Evaluation  Patient identified by MRN, date of birth, ID band Patient awake    Reviewed: Allergy & Precautions, NPO status , Patient's Chart, lab work & pertinent test results  Airway Mallampati: II  TM Distance: >3 FB Neck ROM: Full    Dental no notable dental hx.    Pulmonary neg pulmonary ROS,    Pulmonary exam normal breath sounds clear to auscultation       Cardiovascular negative cardio ROS Normal cardiovascular exam Rhythm:Regular Rate:Normal     Neuro/Psych negative neurological ROS  negative psych ROS   GI/Hepatic negative GI ROS, Neg liver ROS,   Endo/Other  negative endocrine ROS  Renal/GU negative Renal ROS  negative genitourinary   Musculoskeletal negative musculoskeletal ROS (+)   Abdominal   Peds negative pediatric ROS (+)  Hematology negative hematology ROS (+)   Anesthesia Other Findings   Reproductive/Obstetrics (+) Pregnancy                             Anesthesia Physical Anesthesia Plan  ASA: II  Anesthesia Plan: Epidural   Post-op Pain Management:    Induction:   PONV Risk Score and Plan:   Airway Management Planned:   Additional Equipment:   Intra-op Plan:   Post-operative Plan:   Informed Consent: I have reviewed the patients History and Physical, chart, labs and discussed the procedure including the risks, benefits and alternatives for the proposed anesthesia with the patient or authorized representative who has indicated his/her understanding and acceptance.       Plan Discussed with:   Anesthesia Plan Comments:         Anesthesia Quick Evaluation  

## 2020-06-26 NOTE — Lactation Note (Signed)
This note was copied from a baby's chart. Lactation Consultation Note  Patient Name: Boy Skylynn Burkley SWFUX'N Date: 06/26/2020 Reason for consult: Initial assessment;Term;1st time breastfeeding;Primapara  Visited with mom of a 10 hours old FT female, she's a P1 and reported moderate breast changes during the pregnancy (size and pigmentation). She participated in the [facility] program at the [facility] and she's already familiar with hand expression. When LC revised hand expression with mom she was able to get small droplets of colostrum from her left breast but not from the right one, praised her for her efforts.  Offered assistance with latch and mom agreed to wake baby up to feed. LC took baby STS to mother's left breast in cross cradle position and baby latched on almost immediately with a big wide open mouth and flanged lips. Mom worried about baby not breathing, reassured her that he can at the breast.   Mom will not follow directions, baby kept breaking the latch because he didn't have any head/neck support, mother kept constantly switching to cross cradle, LC had to constantly reminded her about baby's developmental milestones and lack of head/neck support. A few audible swallows noted during the first 8 minutes of this feeding, baby still nursing when exiting the room.  Reviewed normal newborn behavior, cluster feeding, feeding cues, size of baby's stomach and STS care. Explained to mom why baby needs to be unswadled for feedings at the breast.   Feeding plan:  1. Encouraged mom to feed baby STS 8-12 times/24 hours or sooner if feeding cues are present 2. Hand expression and spoon feeding were also encouraged 3. Mom's feeding choice is to do breast/formula, she may be requesting some at the 24 hours mark  BF brochure, BF resources and feeding diary were reviewed. No support person in mom's room at the time of [facility] consultation. Mom reported all questions and concerns were answered, she's aware of LC OP  services and will call PRN.   Maternal Data Formula Feeding for Exclusion: Yes Reason for exclusion: Mother's choice to formula and breast feed on admission Has patient been taught Hand Expression?: Yes Does the patient have breastfeeding experience prior to this delivery?: No  Feeding Feeding Type: Breast Fed  LATCH Score Latch: Repeated attempts needed to sustain latch, nipple held in mouth throughout feeding, stimulation needed to elicit sucking reflex. (baby kept breaking the latch because mom would not hold the back of his neck)  Audible Swallowing: A few with stimulation  Type of Nipple: Everted at rest and after stimulation  Comfort (Breast/Nipple): Soft / non-tender  Hold (Positioning): Assistance needed to correctly position infant at breast and maintain latch.  LATCH Score: 7  Interventions Interventions: Breast feeding basics reviewed;Assisted with latch;Skin to skin;Breast massage;Hand express;Breast compression;Adjust position;Support pillows  Lactation Tools Discussed/Used WIC Program: Yes   Consult Status Consult Status: Follow-up Date: 06/27/20 Follow-up type: In-patient    Maryruth Apple Venetia Constable 06/26/2020, 5:51 PM

## 2020-06-26 NOTE — Progress Notes (Signed)
Labor Progress Note Catherine Holder is a 29 y.o. G1P0 at [redacted]w[redacted]d presented for IOL for decreased fetal movement.   S: feeling ctx more intensely   O:  BP 123/63    Pulse 70    Temp 99.3 F (37.4 C)    Resp 18    Ht 5\' 3"  (1.6 m)    Wt 87.3 kg    LMP 09/18/2019    SpO2 100%    BMI 34.09 kg/m  EFM: 145/mod variability/pos accels/ neg decels CVE: Dilation: 5 Effacement (%): 80 Cervical Position: Posterior Station: -2 Presentation: Vertex Exam by:: Kianah Harries, MD   A&P: 29 y.o. G1P0 [redacted]w[redacted]d here for IOL for decreased FM #Induction of Labor: Progressing well. S/p one dose cytotec, FB placed at 2200, out at 0045. SVE 5/80/-2, anticipate transition to pitocin when able.   #Pain: planning to get epidural soon  #FWB: cat I  #GBS negative   11-30-2004, MD 1:18 AM

## 2020-06-26 NOTE — Discharge Summary (Signed)
Postpartum Discharge Summary     Patient Name: Catherine Holder DOB: [birth year] MRN: [id number]  Date of admission: 06/25/2020 Delivery date:06/26/2020  Delivering provider: Janet Berlin  Date of discharge: 06/28/2020  Admitting diagnosis: Decreased fetal movement [O36.8190] Intrauterine pregnancy: [redacted]w[redacted]d    Secondary diagnosis:  Active Problems:   Decreased fetal movement   Term pregnancy delivered  Additional problems: none    Discharge diagnosis: Term Pregnancy Delivered                                              Post partum procedures:none Augmentation: AROM, Pitocin, Cytotec and IP Foley Complications: None  Hospital course: Induction of Labor With Vaginal Delivery   29y.o. yo G1P0 at 496w2das admitted to the hospital 06/25/2020 for induction of labor.  Indication for induction: decreased fetal movement.  Patient had an uncomplicated labor course as follows: Membrane Rupture Time/Date: 6:28 AM ,06/26/2020   Delivery Method:Vaginal, Spontaneous  Episiotomy: None  Lacerations:  2nd degree;Perineal  Details of delivery can be found in separate delivery note.  Patient had a routine postpartum course. Patient is discharged home 06/28/20.  Newborn Data: Birth date:06/26/2020  Birth time:7:09 AM  Gender:Female  Living status:Living  Apgars:9 ,9  Weight:3475 g   Magnesium Sulfate received: No BMZ received: No Rhophylac:No MMR:N/A T-DaP:Given postpartum Flu: No Transfusion:No  Physical exam  Vitals:   06/26/20 2055 06/27/20 0527 06/27/20 1316 06/28/20 0500  BP: (!) 110/54 (!) 95/55 107/64 100/63  Pulse: 65 61 63 68  Resp: _0 Temp: 97.9 F (36.6 C) 97.9 F (36.6 C) 98.2 F (36.8 C) 98.3 F (36.8 C)  TempSrc: Oral Oral Oral Oral  SpO2: 99% 99% 99% 100%  Weight:      Height:       General: alert, cooperative and no distress Lochia: appropriate Uterine Fundus: firm Incision: N/A DVT Evaluation: No evidence of DVT seen on physical exam. Labs: Lab  Results  Component Value Date   WBC 6.9 06/25/2020   HGB 11.0 (L) 06/25/2020   HCT 34.3 (L) 06/25/2020   MCV 92.2 06/25/2020   PLT 177 06/25/2020   CMP Latest Ref Rng & Units 01/03/2020  Glucose 65 - 99 mg/dL 85  BUN 6 - 20 mg/dL 6  Creatinine 0.57 - 1.00 mg/dL 0.58  Sodium 134 - 144 mmol/L 139  Potassium 3.5 - 5.2 mmol/L 3.8  Chloride 96 - 106 mmol/L 104  CO2 20 - 29 mmol/L 19(L)  Calcium 8.7 - 10.2 mg/dL 9.3  Total Protein 6.0 - 8.5 g/dL 6.9  Total Bilirubin 0.0 - 1.2 mg/dL <0.2  Alkaline Phos 39 - 117 IU/L 40  AST 0 - 40 IU/L 11  ALT 0 - 32 IU/L 7   Edinburgh Score: Edinburgh Postnatal Depression Scale Screening Tool 06/26/2020  I have been able to laugh and see the funny side of things. 0  I have looked forward with enjoyment to things. 0  I have blamed myself unnecessarily when things went wrong. 1  I have been anxious or worried for no good reason. 0  I have felt scared or panicky for no good reason. 0  Things have been getting on top of me. 0  I have been so unhappy that I have had difficulty sleeping. 1  I have felt sad or miserable. 0  I have  been so unhappy that I have been crying. 0  The thought of harming myself has occurred to me. 0  Edinburgh Postnatal Depression Scale Total 2     After visit meds:  Allergies as of 06/28/2020   No Known Allergies     Medication List    TAKE these medications   acetaminophen 325 MG tablet Commonly known as: Tylenol Take 2 tablets (650 mg total) by mouth every 4 (four) hours as needed (for pain scale < 4).   Blood Pressure Kit Devi 1 kit by Does not apply route once a week. Check Blood Pressure regularly and record readings into the Babyscripts App.  Large Cuff.  DX O90.0   docusate sodium 100 MG capsule Commonly known as: COLACE Take 1 capsule (100 mg total) by mouth 2 (two) times daily. What changed:   when to take this  reasons to take this   ibuprofen 600 MG tablet Commonly known as: ADVIL Take 1 tablet  (600 mg total) by mouth every 6 (six) hours.   Prenate Elite 26-0.6-0.4 MG Tabs Take 1 tablet by mouth daily.   Vitafol Gummies 3.33-0.333-34.8 MG Chew Chew 3 each by mouth daily.   Vitafol Ultra 29-0.6-0.4-200 MG Caps Take 1 capsule by mouth daily before breakfast.        Discharge home in stable condition Infant Feeding: Breast Infant Disposition:home with mother Discharge instruction: per After Visit Summary and Postpartum booklet. Activity: Advance as tolerated. Pelvic rest for 6 weeks.  Diet: routine diet Future Appointments: Future Appointments  Date Time Provider Wann  08/05/2020 10:15 AM Leftwich-Kirby, Kathie Dike, CNM [facility] None   Follow up Visit:   Please schedule this patient for a In person postpartum visit in 6 weeks with the following provider: Any provider. Additional Postpartum F/U:none  Low risk pregnancy complicated by: uncomplicated Delivery mode:  Vaginal, Spontaneous  Anticipated Birth Control:  Unsure   06/28/2020 Janet Berlin, MD

## 2020-06-27 ENCOUNTER — Encounter: Payer: Medicaid Other | Admitting: Obstetrics

## 2020-06-27 MED ORDER — LIDOCAINE 1% INJECTION FOR CIRCUMCISION
0.8000 mL | INJECTION | Freq: Once | INTRAVENOUS | Status: DC
  Filled 2020-06-27: qty 1

## 2020-06-27 MED ORDER — EPINEPHRINE TOPICAL FOR CIRCUMCISION 0.1 MG/ML: 1.0000 [drp] | TOPICAL | Status: DC | PRN

## 2020-06-27 MED ORDER — WHITE PETROLATUM EX OINT: 1.0000 "application " | TOPICAL_OINTMENT | CUTANEOUS | Status: DC | PRN

## 2020-06-27 MED ORDER — ACETAMINOPHEN FOR CIRCUMCISION 160 MG/5 ML: 40.0000 mg | Freq: Once | ORAL | Status: DC

## 2020-06-27 MED ORDER — SUCROSE 24% NICU/PEDS ORAL SOLUTION: 0.5000 mL | OROMUCOSAL | Status: DC | PRN

## 2020-06-27 MED ORDER — ACETAMINOPHEN FOR CIRCUMCISION 160 MG/5 ML: 40.0000 mg | ORAL | Status: DC | PRN

## 2020-06-27 NOTE — Lactation Note (Signed)
This note was copied from a baby's chart. Lactation Consultation Note  Patient Name: Catherine Holder [id number] Date: 06/27/2020   [name] now 47 hours old.  Mom trying to feed formula via bottle and infant crying.  Parents report they do not know what's wrong that he was really calm and quiet the first day and now he is fussy. Urged mom to do some hand expression and put him on the breast,  Mom reports she hasn't done it herself.  Assist with hand expression ands few drops expressed and infant latched easily.  Comes off and on some but calms. Urged to feed at early feeding cues and not make him wait.  Reviewed babies second night. Urged lots of STS from Both parents.  Explained to parents he is transitioning from moms belly to the outside world and that some babies can get quite fussy about it. Infant continued to breastfeed.  Discussed other ways to calm him. Praised breastfeeding.  Urged to call lactation as needed.   Maternal Data    Feeding Feeding Type: Breast Fed  LATCH Score                   Interventions    Lactation Tools Discussed/Used     Consult Status      Catherine Holder 06/27/2020, 11:06 PM

## 2020-06-27 NOTE — Clinical Social Work Note (Signed)
CSW received consult for hx of Anxiety.  CSW met with MOB to offer support and complete assessment.     CSW met with MOB bedside and introduced self and reason for visit. MOB  MOB expressed understanding and declined having FOB Adel Bareche leave the room for assessment. MOB expressed she was experiencing some anxiety about giving birth and stated she had a better experience than she expected. MOB was observed smiling and expressed happiness about the birth of baby Memory Argue. MOB stated she has no mental health history and has never taken medication or attended therapy. MOB declines any SI or HI.   MOB stated baby will go to [facility]. MOB expressed they have all the essential items to care for baby and have a brand new carseat. MOB stated baby will sleep in  a crib. MOB identified FOB as her support system.  CSW provided education regarding the baby blues period vs. perinatal mood disorders, discussed treatment and gave resources for mental health follow up if concerns arise.  CSW recommends self-evaluation during the postpartum time period using the New Mom Checklist from Postpartum Progress and encouraged MOB to contact a medical professional if symptoms are noted at any time.   CSW provided review of Sudden Infant Death Syndrome (SIDS) precautions.   CSW identifies no further need for intervention and no barriers to discharge at this time.  Darra Lis, [place] [facility]

## 2020-06-28 MED ORDER — ACETAMINOPHEN 325 MG PO TABS
650.0000 mg | ORAL_TABLET | ORAL | 0 refills | Status: DC | PRN
Start: 2020-06-28 — End: 2022-11-11

## 2020-06-28 MED ORDER — DOCUSATE SODIUM 100 MG PO CAPS: 100.0000 mg | ORAL_CAPSULE | Freq: Two times a day (BID) | ORAL | 0 refills | Status: DC

## 2020-06-28 MED ORDER — IBUPROFEN 600 MG PO TABS
600.0000 mg | ORAL_TABLET | Freq: Four times a day (QID) | ORAL | 0 refills | Status: DC
Start: 2020-06-28 — End: 2022-11-11

## 2020-06-28 NOTE — Lactation Note (Signed)
This note was copied from a baby's chart. Lactation Consultation Note  Patient Name: Catherine Holder [id number] Date: 06/28/2020 Reason for consult: Follow-up assessment;Primapara;1st time breastfeeding;Term Baby 49hrs old, wt loss 7%, mom sitting in bed, dad sitting on couch, baby asleep in bassinet sucking on pacifier. Mom states baby last breastfed at 7am then took 1.25oz of formula. Mom states would like to breast and formula feed until milk comes in. Baby now fussy, mom agreed to put baby back to breast. Upon hand expression mom c/o tenderness, Mom latched baby shallowly to breast, with c/o of pain, LC broke latch and assisted with getting baby deeper to breast, mom reports with more comfort. Advised signs of proper latch, audible swallows noted. Baby fed ~58mins and released breast on own, nipple round on release. Discussed pacifier use, avoid x19mo, advised frequent feedings at the breast will increase milk supply. Answered mom's questions regarding type of formula to use and colic. Mom with blister to right nipple, advised warm compress x5-50mins before latching, continue with Comfort Gels as needed.  Advised cue based feedings, 8-12 in 24hrs, wake baby if >3hrs since last feeding, expected number of wet/ dirty stools, abnormal signs to alert peds, skin to skin, milk storage guidelines, engorgement and how to manage (hand pump given), encouraged mom to call for outpatient LC appt. Baby with yellowing to face and chest, notified B. Antony Madura, Charity fundraiser. BGilliam, RN, IBCLC  Maternal Data    Feeding Feeding Type: Breast Fed  LATCH Score Latch: Grasps breast easily, tongue down, lips flanged, rhythmical sucking.  Audible Swallowing: A few with stimulation  Type of Nipple: Everted at rest and after stimulation  Comfort (Breast/Nipple): Filling, red/small blisters or bruises, mild/mod discomfort  Hold (Positioning): Assistance needed to correctly position infant at breast and maintain latch.  LATCH  Score: 7  Interventions Interventions: Breast feeding basics reviewed;Assisted with latch;Skin to skin;Hand express;Breast compression;Adjust position;Support pillows;Expressed milk;Comfort gels;Hand pump  Lactation Tools Discussed/Used     Consult Status Consult Status: Complete Date: 06/28/20    Catherine Holder 06/28/2020, 8:39 AM

## 2020-06-30 ENCOUNTER — Other Ambulatory Visit (HOSPITAL_COMMUNITY): Payer: Medicaid Other

## 2020-07-01 ENCOUNTER — Inpatient Hospital Stay (HOSPITAL_COMMUNITY): Payer: Medicaid Other

## 2020-07-01 ENCOUNTER — Inpatient Hospital Stay (HOSPITAL_COMMUNITY)
Admission: AD | Admit: 2020-07-01 | Payer: Medicaid Other | Source: Home / Self Care | Admitting: Obstetrics and Gynecology

## 2020-08-05 ENCOUNTER — Ambulatory Visit (INDEPENDENT_AMBULATORY_CARE_PROVIDER_SITE_OTHER): Payer: Medicaid Other | Admitting: Advanced Practice Midwife

## 2020-08-05 ENCOUNTER — Other Ambulatory Visit (HOSPITAL_COMMUNITY)
Admission: RE | Admit: 2020-08-05 | Discharge: 2020-08-05 | Disposition: A | Discharge status: Home / Self Care | Payer: Medicaid Other | Source: Ambulatory Visit | Attending: Advanced Practice Midwife | Admitting: Advanced Practice Midwife

## 2020-08-05 ENCOUNTER — Encounter: Payer: Self-pay | Admitting: Advanced Practice Midwife

## 2020-08-05 ENCOUNTER — Other Ambulatory Visit: Payer: Self-pay

## 2020-08-05 VITALS — BP 129/89 | HR 79 | Wt 172.0 lb

## 2020-08-05 DIAGNOSIS — F525 Vaginismus not due to a substance or known physiological condition: Secondary | ICD-10-CM | POA: Insufficient documentation

## 2020-08-05 DIAGNOSIS — Z124 Encounter for screening for malignant neoplasm of cervix: Secondary | ICD-10-CM | POA: Diagnosis not present

## 2020-08-05 DIAGNOSIS — Z3009 Encounter for other general counseling and advice on contraception: Secondary | ICD-10-CM

## 2020-08-05 MED ORDER — PHEXXI 1.8-1-0.4 % VA GEL: 5.0000 g | VAGINAL | 11 refills | Status: DC | PRN

## 2020-08-05 NOTE — Progress Notes (Signed)
Post Partum Visit Note  Catherine Holder is a 29 y.o. G80P1001 female who presents for a postpartum visit. She is 5 weeks postpartum following a normal spontaneous vaginal delivery.  I have fully reviewed the prenatal and intrapartum course. The delivery was at 40 gestational weeks.  Anesthesia: epidural. Postpartum course has been unremarkable. Baby is doing well. Baby is feeding by both breast and bottle - [employer] Good Start. Bleeding no bleeding. Bowel function is normal. Bladder function is normal. Patient is not sexually active. Contraception method is none. Postpartum depression screening: Negative.    The pregnancy intention screening data noted above was reviewed. Potential methods of contraception were discussed. The patient elected to proceed with Abstinence and Withdrawal or Other Method, Rx for Phexxi written.    Edinburgh Postnatal Depression Scale - 08/05/20 1023      Edinburgh Postnatal Depression Scale:  In the Past 7 Days   I have been able to laugh and see the funny side of things. 0    I have looked forward with enjoyment to things. 0    I have blamed myself unnecessarily when things went wrong. 0    I have been anxious or worried for no good reason. 0    I have felt scared or panicky for no good reason. 0    Things have been getting on top of me. 0    I have been so unhappy that I have had difficulty sleeping. 0    I have felt sad or miserable. 0    I have been so unhappy that I have been crying. 0    The thought of harming myself has occurred to me. 0    Edinburgh Postnatal Depression Scale Total 0            The following portions of the patient's history were reviewed and updated as appropriate: allergies, current medications, past family history, past medical history, past social history, past surgical history and problem list.  Review of Systems Pertinent items noted in HPI and remainder of comprehensive ROS otherwise negative.    Objective:  Blood  pressure 129/89, pulse 79, weight 172 lb (78 kg), currently breastfeeding.  VS reviewed, nursing note reviewed,  Constitutional: well developed, well nourished, no distress HEENT: normocephalic CV: normal rate Pulm/chest wall: normal effort Breast Exam:  Deferred Abdomen: soft Neuro: alert and oriented x 3 Skin: warm, dry Psych: affect normal Pelvic exam: Cervix pink, visually closed, without lesion, scant white creamy discharge, vaginal walls and external genitalia normal Bimanual exam: Cervix 0/long/high, firm, anterior, neg CMT, uterus nontender, nonenlarged, adnexa without tenderness, enlargement, or mass  Assessment:   1. Screening for cervical cancer  - Cytology - PAP( [place])  2. Postpartum care following vaginal delivery   3. Encounter for counseling regarding contraception --Pt used natural family planning before pregnancy, wants to wait 1 year before conceiving.  With breastfeeding, and supplementing with formula, menses may be irregular, difficult to use calendar/app to track fertile days.  Options reviewed and pt would like to try nonhormonal option.  Samples given and Rx for Phexxi.  - Lactic Ac-Citric Ac-Pot Bitart (PHEXXI) 1.8-1-0.4 % GEL; Place 5 g vaginally as needed.  Dispense: 60 g; Refill: 11  Plan:   Essential components of care per ACOG recommendations:  1.  Mood and well being: Patient with negative depression screening today. Reviewed local resources for support.  - Patient does not use tobacco.  - hx of drug use? No   2.  Infant care and feeding:  -Patient currently breastmilk feeding? Yes  If breastmilk feeding discussed return to work and pumping. If needed, patient was provided letter for work to allow for every 2-3 hr pumping breaks, and to be granted a private location to express breastmilk and refrigerated area to store breastmilk. Reviewed importance of draining breast regularly to support lactation. -Social determinants of health (SDOH)  reviewed in EPIC. No concerns  3. Sexuality, contraception and birth spacing - Patient does not want a pregnancy in the next year.    - Reviewed forms of contraception in tiered fashion. Patient desired nonhormonal options/ Phexxi today.   - Discussed birth spacing of 18 months  4. Sleep and fatigue -Encouraged family/partner/community support of 4 hrs of uninterrupted sleep to help with mood and fatigue  5. Physical Recovery  - Discussed patients delivery - Patient had a second degree laceration, perineal healing reviewed. Patient expressed understanding - Patient has urinary incontinence? No  - Patient is safe to resume physical and sexual activity  6.  Health Maintenance - Last pap smear done: Never   Sharen Counter, CNM Center for [employer], [facility]

## 2020-08-05 NOTE — Patient Instructions (Signed)
Lactic acid; Citric acid; Potassium bitartrate vaginal gel What is this medicine? LACTIC ACID; CITRIC ACID; POTASSIUM BITARTRATE (LAK tuhk AS id; SIH trik AS id; poe TASS ee um bi TAAR treit) is used for birth control when you have sexual intercourse to help prevent pregnancy. This medicine may be used for other purposes; ask your health care provider or pharmacist if you have questions. COMMON BRAND NAME(S): PHEXXI What should I tell my health care provider before I take this medicine? They need to know if you have any of these conditions:  frequent kidney or urinary tract infections  HIV or AIDS  pelvic or vaginal infection  sexually transmitted disease, like herpes, gonorrhea, or chlamydia  an unusual or allergic reaction to lactic acid, citric acid, potassium bitartrate, other medicines, foods, dyes, or preservatives  pregnant or trying to get pregnant  breast-feeding How should I use this medicine? This medicine is for use in the vagina only. Follow the directions on the prescription label. Wash hands before and after use. To prevent pregnancy, it is very important that this product is used properly. This product must be applied before sexual contact begins. Do not use it in the rectum. Make sure you carefully read and follow the instructions that come with the product. If you have vaginal sex more than 1 time within 1 hour, you must use a new applicator. Use exactly as directed. Talk to your pediatrician regarding the use of this medicine in children. Special care may be needed. Overdosage: If you think you have taken too much of this medicine contact a poison control center or emergency room at once. NOTE: This medicine is only for you. Do not share this medicine with others. What if I miss a dose? Use before each time you have sexual intercourse as directed on the label. If you miss a dose, you may become pregnant. What may interact with this medicine? Interactions are not  expected. This birth control may be used with other medicines used in the vagina to treat infections including miconazole, metronidazole, and tioconazole. If you have questions ask your doctor or pharmacist. Do not use any other vaginal products at the same time without talking to your health care professional. This list may not describe all possible interactions. Give your health care provider a list of all the medicines, herbs, non-prescription drugs, or dietary supplements you use. Also tell them if you smoke, drink alcohol, or use illegal drugs. Some items may interact with your medicine. What should I watch for while using this medicine? This product does not protect you against HIV infection (AIDS) or other sexually transmitted diseases. This product may be used at any time of your menstrual cycle. This product may be used as soon as your healthcare provider tells you it is safe for you to have sex after childbirth, abortion, or miscarriage. This product may be used with most hormonal birth control methods; a vaginal diaphragm; or latex, polyurethane and polyisoprene condoms. Avoid using this product with contraceptive vaginal rings. What side effects may I notice from receiving this medicine? Side effects that you should report to your doctor or health care professional as soon as possible:  allergic reactions like skin rash, itching or hives, swelling  signs and symptoms of infection like fever; chills; pelvic pain; cloudy urine; pain or trouble passing urine  vaginal discharge, itching or odor  vaginal irritation or burning Side effects that usually do not require medical attention (report these to your doctor or health care professional   if they continue or are bothersome):  mild vaginal irritation This list may not describe all possible side effects. Call your doctor for medical advice about side effects. You may report side effects to FDA at 1-800-FDA-1088. Where should I keep my  medicine? Keep out of the reach of children. Store at room temperature between 15 and 30 degrees C (59 and 86 degrees F). Keep in the foil pouch until ready for use. Follow the directions on the product label. Throw away any unused medicine after the expiration date. NOTE: This sheet is a summary. It may not cover all possible information. If you have questions about this medicine, talk to your doctor, pharmacist, or health care provider.  2020 Elsevier/Gold Standard (2019-03-15 09:34:36)  

## 2020-08-08 LAB — CYTOLOGY - PAP
Comment: NEGATIVE
Diagnosis: NEGATIVE
High risk HPV: NEGATIVE

## 2022-03-21 IMAGING — US US MFM OB FOLLOW-UP
1 series · 14 of 28 positions shown · non-contrast
Comparison: none

[Series 1: us mfm ob follow-up · 44 acquisitions, 14 frames shown]
[im 2/44]
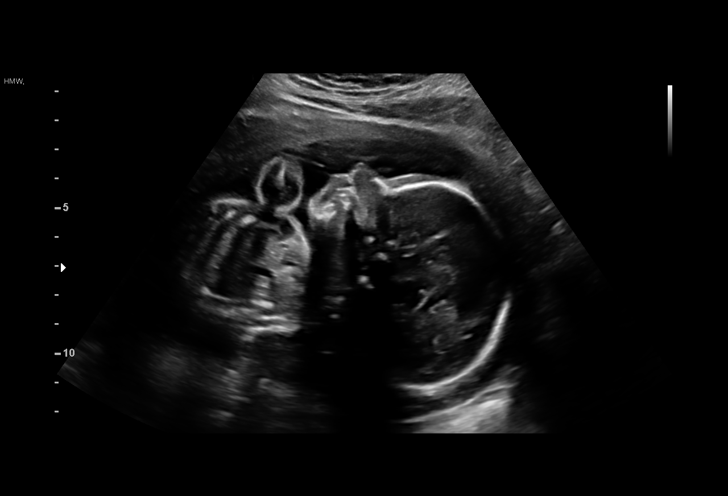
[im 5/44]
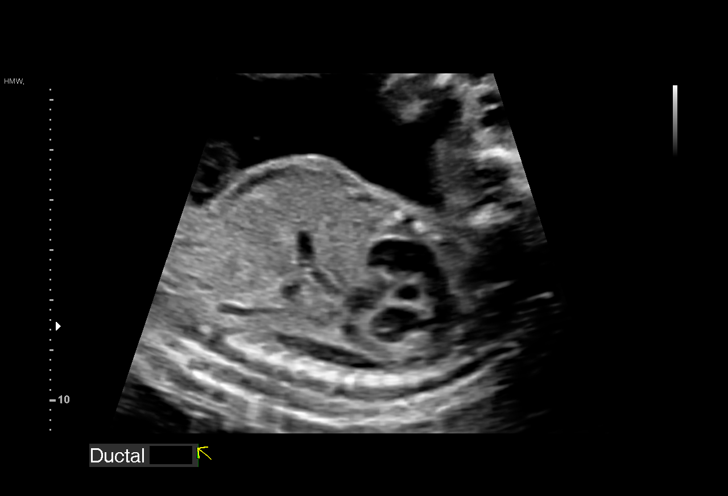
[im 8/44]
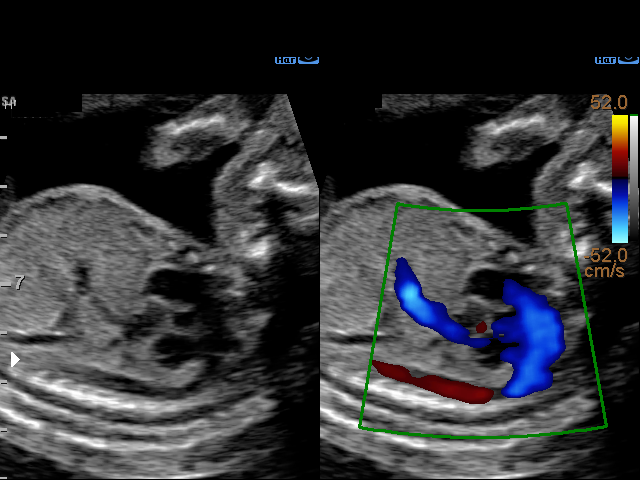
[im 12/44]
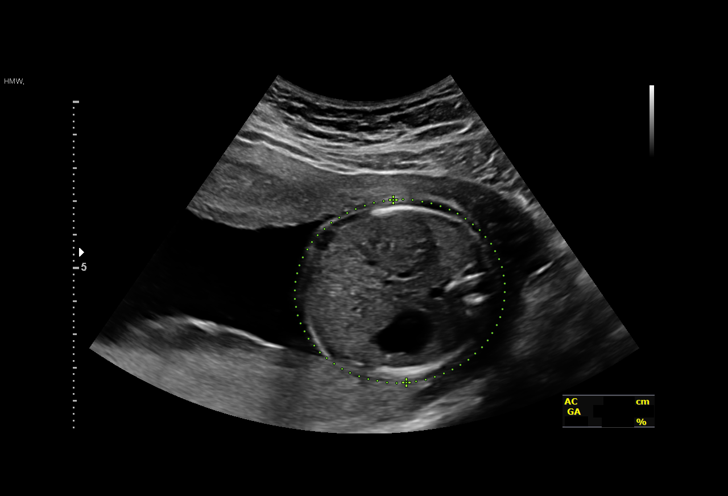
[im 15/44]
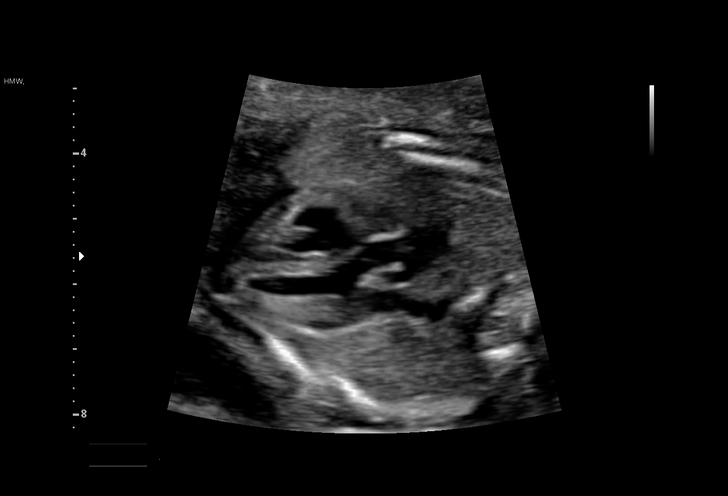
[im 18/44]
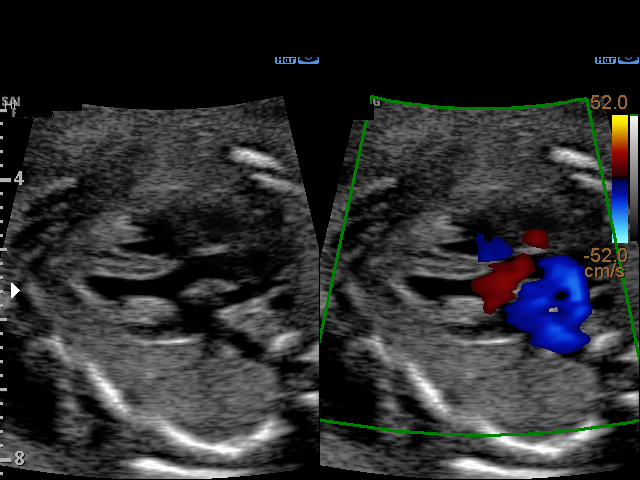
[im 21/44]
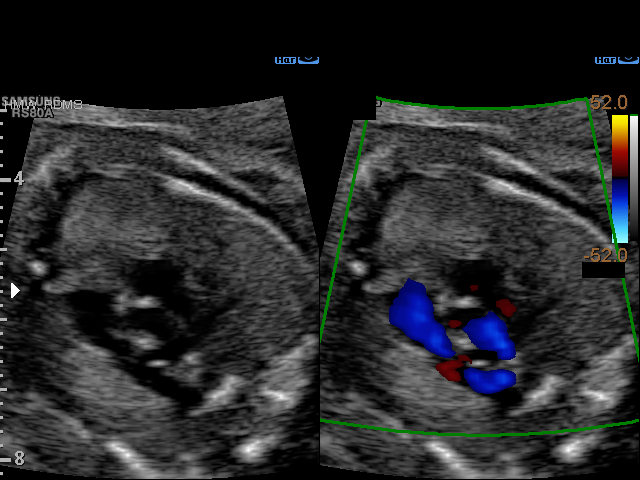
[im 24/44]
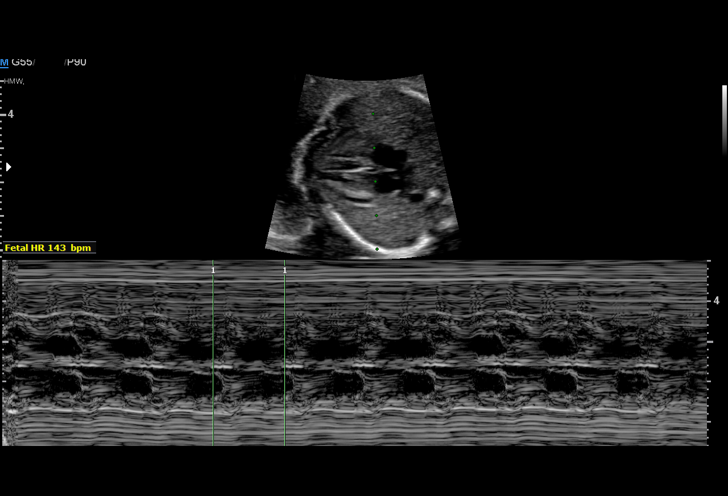
[im 28/44]
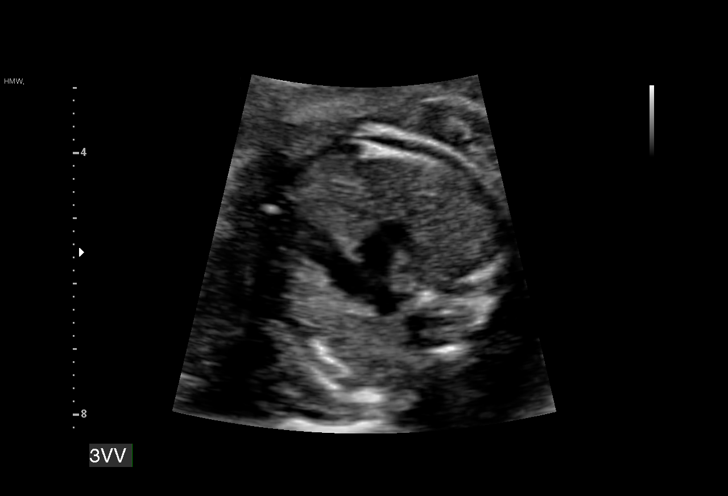
[im 31/44]
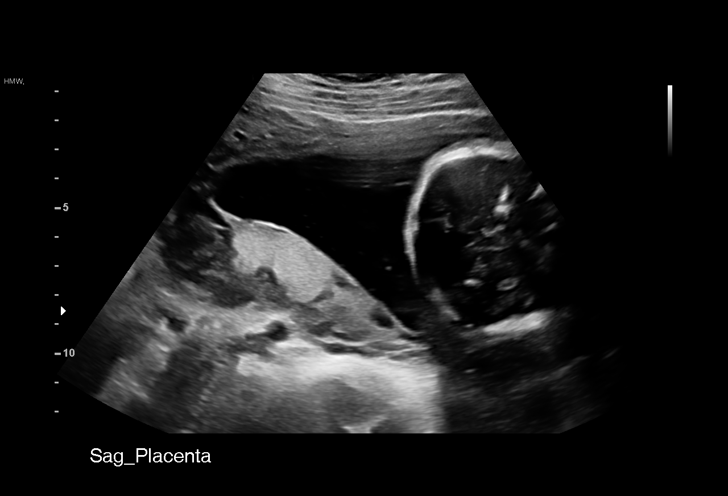
[im 34/44]
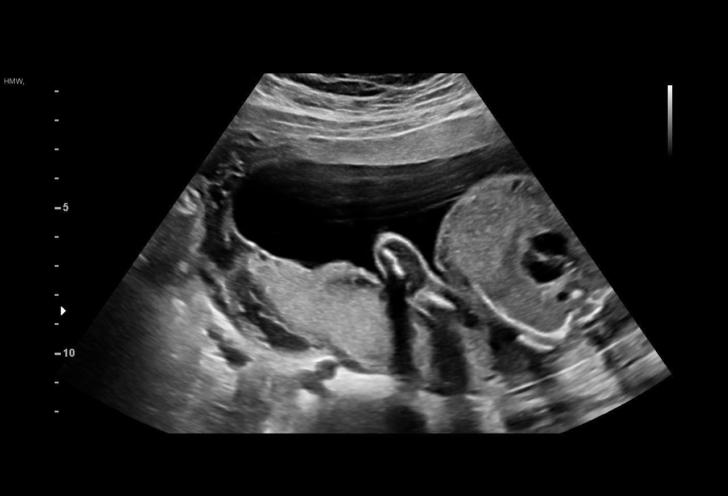
[im 37/44]
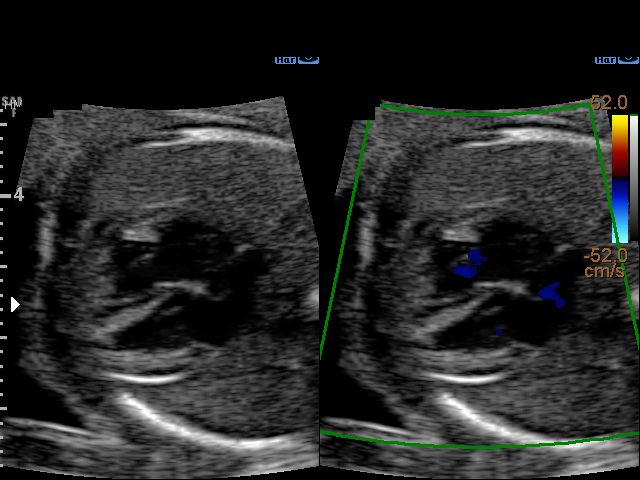
[im 40/44]
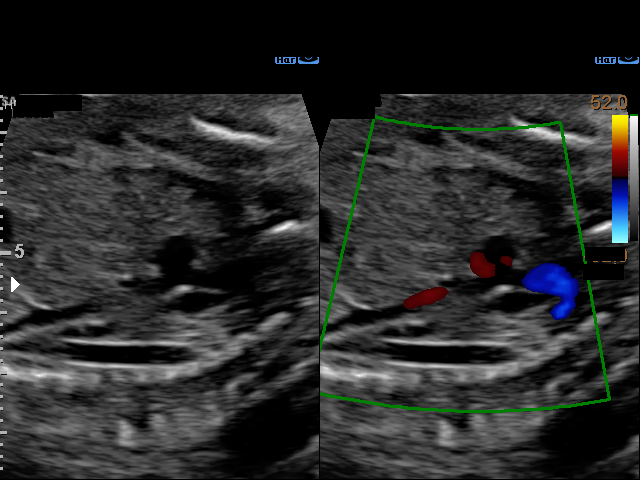
[im 44/44]
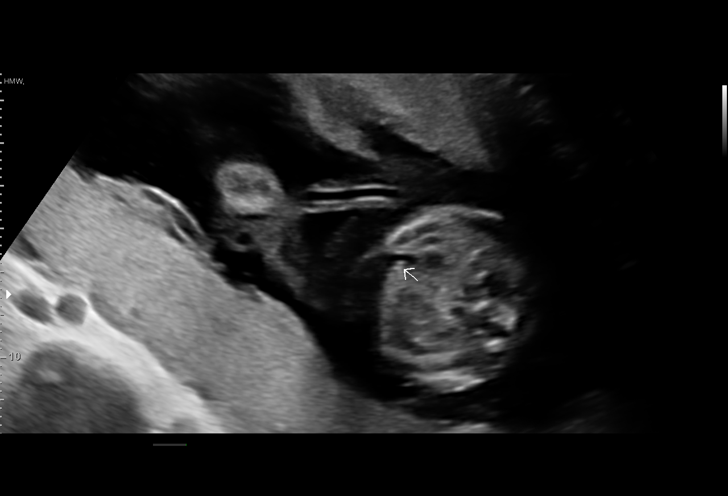

[14 of 28 positions shown; findings below may reference images not displayed]

Indications

 23 weeks gestation of pregnancy
 AFP neg
 Encounter for other antenatal screening
 follow-up
Fetal Evaluation

 Num Of Fetuses:          1
 Fetal Heart Rate(bpm):   143
 Cardiac Activity:        Observed
 Presentation:            Cephalic
 Placenta:                Posterior
 P. Cord Insertion:       Previously Visualized

 Amniotic Fluid
 AFI FV:      Within normal limits

                             Largest Pocket(cm)

Biometry

 BPD:      57.4  mm     G. Age:  23w 4d         67  %    CI:        76.06   %    70 - 86
                                                         FL/HC:       19.2  %    19.2 -
 HC:      208.6  mm     G. Age:  23w 0d         33  %    HC/AC:       1.10       1.05 -
 AC:      188.8  mm     G. Age:  23w 4d         63  %    FL/BPD:      69.7  %    71 - 87
 FL:         40  mm     G. Age:  22w 6d         35  %    FL/AC:       21.2  %    20 - 24

 Est. FW:     581   gm     1 lb 4 oz     57  %
OB History
 Gravidity:    1         Term:   0        Prem:   0        SAB:   0
 TOP:          0       Ectopic:  0        Living: 0
Gestational Age

 LMP:           23w 0d        Date:  09/18/19                 EDD:   06/24/20
 U/S Today:     23w 2d                                        EDD:   06/22/20
 Best:          23w 0d     Det. By:  LMP  (09/18/19)          EDD:   06/24/20
Anatomy

 Cranium:               Appears normal         LVOT:                   Appears normal
 Cavum:                 Previously seen        Aortic Arch:            Appears normal
 Ventricles:            Appears normal         Ductal Arch:            Appears normal
 Choroid Plexus:        Previously seen        Diaphragm:              Previously seen
 Cerebellum:            Previously seen        Stomach:                Appears normal, left
                                                                       sided
 Posterior Fossa:       Previously seen        Abdomen:                Previously seen
 Nuchal Fold:           Previously seen        Abdominal Wall:         Appears nml (cord
                                                                       insert, abd wall)
 Face:                  Orbits and profile     Cord Vessels:           Previously seen
                        previously seen
 Lips:                  Previously seen        Kidneys:                Appear normal
 Palate:                Not well visualized    Bladder:                Appears normal
 Thoracic:              Appears normal         Spine:                  Ltd views no
                                                                       intracranial signs of
                                                                       NTD
 Heart:                 Appears normal         Upper Extremities:      Previously seen
                        (4CH, axis, and
                        situs)
 RVOT:                  Appears normal         Lower Extremities:      Previously seen

 Other:  Heels and 5th digit visualized prev. Nasal bone visualized prev.
         Technically difficult due to fetal position.
Cervix Uterus Adnexa

 Cervix
 Length:            3.1  cm.
 Normal appearance by transabdominal scan.

 Uterus
 No abnormality visualized.

 Right Ovary
 No adnexal mass visualized.

 Left Ovary
 No adnexal mass visualized.

 Cul De Sac
 No free fluid seen.

 Adnexa
 No abnormality visualized.
Impression

 Normal interval growth
 Suboptimal views of the fetal spine normal intracranial
 anatomy.
 Good fetal movement amniotic fluid
Recommendations

 Follow growth as clinically indicated

## 2022-10-18 NOTE — L&D Delivery Note (Cosign Needed Addendum)
Delivery Note 32 y.o. G2P2002 at [redacted]w[redacted]d admitted for PROM at 0815 on 6/20.  At 5:34 PM a viable female was delivered via Vaginal, Spontaneous (Presentation:   Occiput Anterior).  APGAR: 9, 9; weight TBD.  Placenta status: Spontaneous, Intact.  Cord: 3 vessels with the following complications: None.  Cord pH: not collected   Anesthesia: Epidural Episiotomy: None Lacerations: 1st degree;Perineal Suture Repair: 3.0 vicryl Est. Blood Loss (mL): 272  After delivery of the placenta, and repair of laceration, paragard IUD was inserted. See procedure note for details.  Mom to postpartum.  Baby to Couplet care / Skin to Skin.  Sheppard Evens MD MPH OB Fellow, Faculty Practice [facility], Center for [facility] 04/08/2023

## 2022-10-22 ENCOUNTER — Ambulatory Visit (INDEPENDENT_AMBULATORY_CARE_PROVIDER_SITE_OTHER): Payer: Medicaid Other

## 2022-10-22 ENCOUNTER — Other Ambulatory Visit (HOSPITAL_COMMUNITY)
Admission: RE | Admit: 2022-10-22 | Discharge: 2022-10-22 | Disposition: A | Discharge status: Home / Self Care | Payer: Medicaid Other | Source: Ambulatory Visit | Attending: Obstetrics and Gynecology | Admitting: Obstetrics and Gynecology

## 2022-10-22 VITALS — BP 100/61 | HR 63 | Ht 62.0 in | Wt 167.6 lb

## 2022-10-22 DIAGNOSIS — Z3480 Encounter for supervision of other normal pregnancy, unspecified trimester: Secondary | ICD-10-CM | POA: Diagnosis present

## 2022-10-22 DIAGNOSIS — Z1332 Encounter for screening for maternal depression: Secondary | ICD-10-CM

## 2022-10-22 DIAGNOSIS — Z3A16 16 weeks gestation of pregnancy: Secondary | ICD-10-CM | POA: Diagnosis not present

## 2022-10-22 DIAGNOSIS — O3680X0 Pregnancy with inconclusive fetal viability, not applicable or unspecified: Secondary | ICD-10-CM | POA: Diagnosis not present

## 2022-10-22 DIAGNOSIS — Z3482 Encounter for supervision of other normal pregnancy, second trimester: Secondary | ICD-10-CM | POA: Diagnosis not present

## 2022-10-22 MED ORDER — VITAFOL ULTRA 29-0.6-0.4-200 MG PO CAPS: 1.0000 | ORAL_CAPSULE | Freq: Every day | ORAL | 11 refills | Status: AC

## 2022-10-22 NOTE — Progress Notes (Signed)
New OB Intake  I connected withNAME@  on 10/22/22 at  8:15 AM EST by in person and verified that I am speaking with the correct person using two identifiers. Nurse is located at [facility] and pt is located at [place].  I discussed the limitations, risks, security and privacy concerns of performing an evaluation and management service by telephone and the availability of in person appointments. I also discussed with the patient that there may be a patient responsible charge related to this service. The patient expressed understanding and agreed to proceed.  I explained I am completing New OB Intake today. We discussed EDD of 04/08/23 that is based on second trimester u/s at 16 week. Pt is G2/P1001. I reviewed her allergies, medications, Medical/Surgical/OB history, and appropriate screenings. I informed her of [facility] services. [facility] information placed in AVS. Based on history, this is a low risk pregnancy.  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 Patient given information for [facility] Healthy Baby website for more information about Women's and [place]. Patient is not interested in water birth. Offered upcoming OB visit with CNM to discuss further.  MyChart/Babyscripts MyChart access verified. I explained pt will have some visits in office and some virtually. Babyscripts instructions given and order placed. Patient verifies receipt of registration text/e-mail. Account successfully created and app downloaded.  Blood Pressure Cuff/Weight Scale Patient has BP cuff from previous pregnancy. Explained after first prenatal appt pt will check weekly and document in 63. Patient does have weight scale.  Anatomy US Explained first scheduled [place] will be around 19 weeks. Dating and viability u/s  completed today. Anatomy US ordered. Date TBD.  Labs Discussed Johnsie Cancel genetic screening with patient. Would like both Panorama and Horizon drawn at new OB visit. Routine prenatal labs needed.  COVID Vaccine Patient has not had COVID vaccine.   Is patient a CenteringPregnancy candidate?  Declined Declined due to Group setting Not a candidate due to  If accepted,   Social Determinants of Health Food Insecurity: Patient denies food insecurity. WIC Referral: Patient is interested in referral to [facility].  Transportation: Patient denies transportation needs. Childcare: Discussed no children allowed at ultrasound appointments. Offered childcare services; patient declines childcare services at this time.  First visit review I reviewed new OB appt with patient. I explained they will have a provider visit that includes all labs completed at intake visit. Explained pt will be seen by TBD at first visit; encounter routed to appropriate provider. Explained that patient will be seen by pregnancy navigator following visit with provider.   Lucianne Lei, RN 10/22/2022  8:16 AM

## 2022-10-24 LAB — AFP, SERUM, OPEN SPINA BIFIDA
AFP MoM: 0.9
AFP Value: 28.5 ng/mL
Gest. Age on Collection Date: 16 weeks
Maternal Age At EDD: 33.3 yr
OSBR Risk 1 IN: 10000
Test Results:: NEGATIVE
Weight: 167 [lb_av]

## 2022-10-24 LAB — CBC/D/PLT+RPR+RH+ABO+RUBIGG...
Antibody Screen: NEGATIVE
Basophils Absolute: 0 10*3/uL (ref 0.0–0.2)
Basos: 1 %
EOS (ABSOLUTE): 0.1 10*3/uL (ref 0.0–0.4)
Eos: 2 %
HCV Ab: NONREACTIVE
HIV Screen 4th Generation wRfx: NONREACTIVE
Hematocrit: 38.4 % (ref 34.0–46.6)
Hemoglobin: 13.2 g/dL (ref 11.1–15.9)
Hepatitis B Surface Ag: NEGATIVE
Immature Grans (Abs): 0 10*3/uL (ref 0.0–0.1)
Immature Granulocytes: 0 %
Lymphocytes Absolute: 1.3 10*3/uL (ref 0.7–3.1)
Lymphs: 21 %
MCH: 31.4 pg (ref 26.6–33.0)
MCHC: 34.4 g/dL (ref 31.5–35.7)
MCV: 91 fL (ref 79–97)
Monocytes Absolute: 0.3 10*3/uL (ref 0.1–0.9)
Monocytes: 5 %
Neutrophils Absolute: 4.4 10*3/uL (ref 1.4–7.0)
Neutrophils: 71 %
Platelets: 240 10*3/uL (ref 150–450)
RBC: 4.2 x10E6/uL (ref 3.77–5.28)
RDW: 12.3 % (ref 11.7–15.4)
RPR Ser Ql: NONREACTIVE
Rh Factor: POSITIVE
Rubella Antibodies, IGG: 7.84 index (ref >0.99)
WBC: 6.2 10*3/uL (ref 3.4–10.8)

## 2022-10-24 LAB — URINE CULTURE, OB REFLEX

## 2022-10-24 LAB — HCV INTERPRETATION

## 2022-10-24 LAB — CULTURE, OB URINE

## 2022-10-25 LAB — CERVICOVAGINAL ANCILLARY ONLY
Chlamydia: NEGATIVE
Comment: NEGATIVE
Comment: NEGATIVE
Comment: NORMAL
Neisseria Gonorrhea: NEGATIVE
Trichomonas: NEGATIVE

## 2022-10-29 LAB — PANORAMA PRENATAL TEST FULL PANEL:PANORAMA TEST PLUS 5 ADDITIONAL MICRODELETIONS: FETAL FRACTION: 20.7

## 2022-11-11 ENCOUNTER — Encounter: Payer: Self-pay | Admitting: Student

## 2022-11-11 ENCOUNTER — Ambulatory Visit (INDEPENDENT_AMBULATORY_CARE_PROVIDER_SITE_OTHER): Payer: Medicaid Other | Admitting: Student

## 2022-11-11 VITALS — BP 102/62 | HR 81 | Wt 178.0 lb

## 2022-11-11 DIAGNOSIS — Z3482 Encounter for supervision of other normal pregnancy, second trimester: Secondary | ICD-10-CM | POA: Diagnosis not present

## 2022-11-11 DIAGNOSIS — Z3480 Encounter for supervision of other normal pregnancy, unspecified trimester: Secondary | ICD-10-CM

## 2022-11-11 DIAGNOSIS — Z3A18 18 weeks gestation of pregnancy: Secondary | ICD-10-CM | POA: Diagnosis not present

## 2022-11-11 DIAGNOSIS — F525 Vaginismus not due to a substance or known physiological condition: Secondary | ICD-10-CM

## 2022-11-11 NOTE — Progress Notes (Signed)
History:   Catherine Holder is a 32 y.o. G2P1001 at [redacted]w[redacted]d by early ultrasound being seen today for her first obstetrical visit.  Her obstetrical history is significant for  psychological vaginismus . Patient does intend to breast feed. Pregnancy history fully reviewed.  Patient reports  intermittent back pain after standing for long hours .     HISTORY: OB History  Gravida Para Term Preterm AB Living  2 1 1  0 0 1  SAB IAB Ectopic Multiple Live Births  0 0 0 0 1    # Outcome Date GA Lbr Len/2nd Weight Sex Delivery Anes PTL Lv  2 Current           1 Term 06/26/20 [redacted]w[redacted]d / 00:46 7 lb 10.6 oz (3.475 kg) M Vag-Spont EPI  LIV     Name: Catherine Holder     Apgar1: 9  Apgar5: 9     Last pap smear was done 07/2020 and was normal  No past medical history on file. No past surgical history on file. Family History  Problem Relation Age of Onset   Cancer Mother    Cancer Maternal Grandfather    Social History   Tobacco Use   Smoking status: Never   Smokeless tobacco: Never  Vaping Use   Vaping Use: Never used  Substance Use Topics   Alcohol use: Never   Drug use: Never   No Known Allergies Current Outpatient Medications on File Prior to Visit  Medication Sig Dispense Refill   acetaminophen (TYLENOL) 325 MG tablet Take 2 tablets (650 mg total) by mouth every 4 (four) hours as needed (for pain scale < 4). (Patient not taking: Reported on 08/05/2020) 90 tablet 0   Blood Pressure Monitoring (BLOOD PRESSURE KIT) DEVI 1 kit by Does not apply route once a week. Check Blood Pressure regularly and record readings into the Babyscripts App.  Large Cuff.  DX O90.0 1 each 0   docusate sodium (COLACE) 100 MG capsule Take 1 capsule (100 mg total) by mouth 2 (two) times daily. 30 capsule 0   ibuprofen (ADVIL) 600 MG tablet Take 1 tablet (600 mg total) by mouth every 6 (six) hours. (Patient not taking: Reported on 08/05/2020) 90 tablet 0   Lactic Ac-Citric Ac-Pot Bitart (PHEXXI) 1.8-1-0.4 % GEL  Place 5 g vaginally as needed. 60 g 11   Prenat-Fe Poly-Methfol-FA-DHA (VITAFOL ULTRA) 29-0.6-0.4-200 MG CAPS Take 1 capsule by mouth daily before breakfast. 90 capsule 4   Prenat-Fe Poly-Methfol-FA-DHA (VITAFOL ULTRA) 29-0.6-0.4-200 MG CAPS Take 1 capsule by mouth daily. 30 capsule 11   Prenatal Vit w/Fe-Methylfol-FA (PRENATE ELITE) 26-0.6-0.4 MG TABS Take 1 tablet by mouth daily. 30 tablet 11   Prenatal Vit-Fe Phos-FA-Omega (VITAFOL GUMMIES) 3.33-0.333-34.8 MG CHEW Chew 3 each by mouth daily. 90 tablet 11   No current facility-administered medications on file prior to visit.    Review of Systems Pertinent items noted in HPI and remainder of comprehensive ROS otherwise negative. Physical Exam:  There were no vitals filed for this visit.   Uterus:     Pelvic Exam: Perineum: Not indicated   Vulva: Not indicated   Vagina:  Not indicated   Cervix: Not indicated   Adnexa: Not indicated   Bony Pelvis: Not indicated  System: General: well-developed, well-nourished female in no acute distress   Breasts:  normal appearance or tenderness bilaterally   Skin: normal coloration and turgor, no rashes   Neurologic: oriented, normal, negative, normal mood   Extremities: normal strength, tone, and  muscle mass, ROM of all joints is normal   HEENT PERRLA, extraocular movement intact and sclera clear, + nystagmus   Mouth/Teeth mucous membranes moist, missing posterior , upper teeth   Neck supple and no masses   Cardiovascular: regular rate and rhythm   Respiratory:  no respiratory distress, normal breath sounds   Abdomen: soft, non-tender; bowel sounds normal; no masses     Assessment:    Pregnancy: G2P1001 Patient Active Problem List   Diagnosis Date Noted   Supervision of other normal pregnancy, antepartum 10/22/2022   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 Supervision of other normal pregnancy,  antepartum - Doing well today, FHR normal - Discussed comfort measures for intermittent back pain such as tylenol, stretching, both rest and light movement, and benefits of pregnancy support belt.   2. [redacted] weeks gestation of pregnancy - continue routine follow-up   3. Psychologic vaginismus - Addressed anxiety associated with birthing process, patient states feeling supported - Discussed options for psychotherapy and pelvic floor therapy. Patient expressed interest in both  Initial labs drawn. Continue prenatal vitamins. Genetic Screening discussed, First trimester screen and NIPS: results reviewed. Ultrasound discussed; fetal anatomic survey:  is scheduled . Problem list reviewed and updated. The nature of [place] with multiple MDs and other Advanced Practice Providers was explained to patient; also emphasized that residents, students are part of our team. Routine obstetric precautions reviewed. No follow-ups on file.[email or web address]    Johnston Ebbs, [place] for [employer], [place]

## 2022-11-11 NOTE — Progress Notes (Signed)
NOB reports fetal movement, denies pain. Intake and u/s completed on 10/22/22.

## 2022-11-16 ENCOUNTER — Ambulatory Visit: Payer: Medicaid Other | Attending: Obstetrics and Gynecology

## 2022-11-16 ENCOUNTER — Other Ambulatory Visit: Payer: Self-pay | Admitting: Maternal & Fetal Medicine

## 2022-11-16 DIAGNOSIS — Z3A19 19 weeks gestation of pregnancy: Secondary | ICD-10-CM | POA: Insufficient documentation

## 2022-11-16 DIAGNOSIS — Z3480 Encounter for supervision of other normal pregnancy, unspecified trimester: Secondary | ICD-10-CM

## 2022-11-16 DIAGNOSIS — Z363 Encounter for antenatal screening for malformations: Secondary | ICD-10-CM | POA: Diagnosis not present

## 2022-11-16 DIAGNOSIS — Z362 Encounter for other antenatal screening follow-up: Secondary | ICD-10-CM

## 2022-12-04 ENCOUNTER — Encounter (HOSPITAL_COMMUNITY): Payer: Self-pay | Admitting: Obstetrics and Gynecology

## 2022-12-04 ENCOUNTER — Inpatient Hospital Stay (HOSPITAL_COMMUNITY): Payer: Medicaid Other

## 2022-12-04 ENCOUNTER — Inpatient Hospital Stay (HOSPITAL_COMMUNITY)
Admission: AD | Admit: 2022-12-04 | Discharge: 2022-12-04 | Disposition: A | Discharge status: Home / Self Care | Payer: Medicaid Other | Attending: Obstetrics and Gynecology | Admitting: Obstetrics and Gynecology

## 2022-12-04 DIAGNOSIS — Z3A22 22 weeks gestation of pregnancy: Secondary | ICD-10-CM | POA: Insufficient documentation

## 2022-12-04 DIAGNOSIS — Z1152 Encounter for screening for COVID-19: Secondary | ICD-10-CM | POA: Diagnosis not present

## 2022-12-04 DIAGNOSIS — Z3480 Encounter for supervision of other normal pregnancy, unspecified trimester: Secondary | ICD-10-CM

## 2022-12-04 DIAGNOSIS — J101 Influenza due to other identified influenza virus with other respiratory manifestations: Secondary | ICD-10-CM | POA: Diagnosis present

## 2022-12-04 DIAGNOSIS — O99512 Diseases of the respiratory system complicating pregnancy, second trimester: Secondary | ICD-10-CM | POA: Insufficient documentation

## 2022-12-04 HISTORY — DX: Other specified health status: Z78.9

## 2022-12-04 LAB — CBC WITH DIFFERENTIAL/PLATELET
Abs Immature Granulocytes: 0.03 10*3/uL (ref 0.00–0.07)
Basophils Absolute: 0 10*3/uL (ref 0.0–0.1)
Basophils Relative: 0 %
Eosinophils Absolute: 0 10*3/uL (ref 0.0–0.5)
Eosinophils Relative: 0 %
HCT: 32.8 % — ABNORMAL LOW (ref 36.0–46.0)
Hemoglobin: 10.9 g/dL — ABNORMAL LOW (ref 12.0–15.0)
Immature Granulocytes: 0 %
Lymphocytes Relative: 4 %
Lymphs Abs: 0.4 10*3/uL — ABNORMAL LOW (ref 0.7–4.0)
MCH: 31.3 pg (ref 26.0–34.0)
MCHC: 33.2 g/dL (ref 30.0–36.0)
MCV: 94.3 fL (ref 80.0–100.0)
Monocytes Absolute: 0.5 10*3/uL (ref 0.1–1.0)
Monocytes Relative: 6 %
Neutro Abs: 7.1 10*3/uL (ref 1.7–7.7)
Neutrophils Relative %: 90 %
Platelets: 173 10*3/uL (ref 150–400)
RBC: 3.48 MIL/uL — ABNORMAL LOW (ref 3.87–5.11)
RDW: 12.8 % (ref 11.5–15.5)
WBC: 8 10*3/uL (ref 4.0–10.5)
nRBC: 0 % (ref 0.0–0.2)

## 2022-12-04 LAB — URINALYSIS, ROUTINE W REFLEX MICROSCOPIC
Bilirubin Urine: NEGATIVE
Glucose, UA: NEGATIVE mg/dL
Hgb urine dipstick: NEGATIVE
Ketones, ur: 15 mg/dL — AB
Leukocytes,Ua: NEGATIVE
Nitrite: NEGATIVE
Protein, ur: 30 mg/dL — AB
Specific Gravity, Urine: 1.02 (ref 1.005–1.030)
pH: 7.5 (ref 5.0–8.0)

## 2022-12-04 LAB — RESP PANEL BY RT-PCR (RSV, FLU A&B, COVID)  RVPGX2
Influenza A by PCR: POSITIVE — AB
Influenza B by PCR: NEGATIVE
Resp Syncytial Virus by PCR: NEGATIVE
SARS Coronavirus 2 by RT PCR: NEGATIVE

## 2022-12-04 LAB — URINALYSIS, MICROSCOPIC (REFLEX): WBC, UA: NONE SEEN WBC/hpf (ref 0–5)

## 2022-12-04 MED ORDER — OSELTAMIVIR PHOSPHATE 75 MG PO CAPS: 75.0000 mg | ORAL_CAPSULE | Freq: Two times a day (BID) | ORAL | 0 refills | Status: DC

## 2022-12-04 MED ORDER — LACTATED RINGERS IV BOLUS
1000.0000 mL | Freq: Once | INTRAVENOUS | Status: AC
  Administered 2022-12-04: 1000 mL via INTRAVENOUS

## 2022-12-04 MED ORDER — ACETAMINOPHEN 500 MG PO TABS
1000.0000 mg | ORAL_TABLET | Freq: Once | ORAL | Status: AC
  Administered 2022-12-04: 1000 mg via ORAL
  Filled 2022-12-04: qty 2

## 2022-12-04 NOTE — Discharge Instructions (Signed)

## 2022-12-04 NOTE — MAU Note (Signed)
...  Catherine Holder is a 32 y.o. at 44w1dhere in MAU reporting: Cough, fatigue, SOB, palpitations, sore throat and body aches. She reports this all started yesterday and has gotten progressively worse. She reports she feels as if she cannot take a deep breath. Fever in MAU 101.5 F. Denies VB or LOF. Endorses occasional flutters. Has not felt any today.  Has not taken any medications today. Took Tylenol last night, did not help with her body aches.  Onset of complaint: Yesterday  Pain score: 6/10 body aches  FHT: 165 doppler Lab orders placed from triage:  Resp Panel

## 2022-12-04 NOTE — MAU Provider Note (Signed)
History     CSN: [id number]  Arrival date and time: 12/04/22 1252   None     Chief Complaint  Patient presents with   Cough   Fever   Generalized Body Aches   HPI Catherine Holder is a 32 y.o. G2P1001 at 6w1dwho presents to MAU for flu-like symptoms. She reports symptoms started yesterday but are much worse today. She is reporting dry cough, sore throat, fever, runny nose, body aches, and shortness of breath. She reports shortness of breath was worse yesterday but says "it seems more psychological because I am focusing on my breathing". She denies chest pain or palpitations. No known sick contacts. No abdominal pain, vaginal bleeding, discharge, or urinary s/s. She took Tylenol last night which did not help. Has not taken anything today.    OB History     Gravida  2   Para  1   Term  1   Preterm      AB      Living  1      SAB      IAB      Ectopic      Multiple  0   Live Births  1           Past Medical History:  Diagnosis Date   Medical history non-contributory     History reviewed. No pertinent surgical history.  Family History  Problem Relation Age of Onset   Cancer Mother    Cancer Maternal Grandfather     Social History   Tobacco Use   Smoking status: Never   Smokeless tobacco: Never  Vaping Use   Vaping Use: Never used  Substance Use Topics   Alcohol use: Never   Drug use: Never    Allergies: No Known Allergies  No medications prior to admission.   Review of Systems  Constitutional:  Positive for fever.  HENT:  Positive for rhinorrhea and sore throat. Negative for congestion.   Respiratory:  Positive for cough and shortness of breath.   Cardiovascular: Negative.   Gastrointestinal: Negative.   Genitourinary: Negative.   Musculoskeletal:  Positive for myalgias.  Neurological: Negative.    Physical Exam   Patient Vitals for the past 24 hrs:  BP Temp Temp src Pulse Resp SpO2 Height Weight  12/04/22 1603 (!) 100/50 98.9  F (37.2 C) Oral (!) 105 -- 100 % -- --  12/04/22 1452 -- -- -- (!) 105 -- -- -- --  12/04/22 1451 -- 99.6 F (37.6 C) Oral (!) 112 18 -- -- --  12/04/22 1327 118/63 (!) 101.5 F (38.6 C) Oral (!) 127 20 99 % 5' 2"$  (1.575 m) 81.5 kg   Physical Exam Vitals and nursing note reviewed.  Constitutional:      General: She is not in acute distress. Eyes:     Extraocular Movements: Extraocular movements intact.     Pupils: Pupils are equal, round, and reactive to light.  Cardiovascular:     Rate and Rhythm: Regular rhythm. Tachycardia present.     Heart sounds: Normal heart sounds.  Pulmonary:     Effort: Pulmonary effort is normal. No respiratory distress.     Breath sounds: Normal breath sounds. No wheezing.  Abdominal:     Palpations: Abdomen is soft.     Tenderness: There is no abdominal tenderness.  Musculoskeletal:        General: Normal range of motion.     Cervical back: Normal range of motion.  Skin:  General: Skin is warm and dry.  Neurological:     General: No focal deficit present.     Mental Status: She is alert and oriented to person, place, and time.  Psychiatric:        Mood and Affect: Mood normal.        Behavior: Behavior normal.    FHR: 165 bpm via doppler   Results for orders placed or performed during the hospital encounter of 12/04/22 (from the past 24 hour(s))  Resp panel by RT-PCR (RSV, Flu A&B, Covid) Anterior Nasal Swab     Status: Abnormal   Collection Time: 12/04/22  1:39 PM   Specimen: Anterior Nasal Swab  Result Value Ref Range   SARS Coronavirus 2 by RT PCR NEGATIVE NEGATIVE   Influenza A by PCR POSITIVE (A) NEGATIVE   Influenza B by PCR NEGATIVE NEGATIVE   Resp Syncytial Virus by PCR NEGATIVE NEGATIVE  CBC with Differential/Platelet     Status: Abnormal   Collection Time: 12/04/22  2:13 PM  Result Value Ref Range   WBC 8.0 4.0 - 10.5 K/uL   RBC 3.48 (L) 3.87 - 5.11 MIL/uL   Hemoglobin 10.9 (L) 12.0 - 15.0 g/dL   HCT 32.8 (L) 36.0 - 46.0  %   MCV 94.3 80.0 - 100.0 fL   MCH 31.3 26.0 - 34.0 pg   MCHC 33.2 30.0 - 36.0 g/dL   RDW 12.8 11.5 - 15.5 %   Platelets 173 150 - 400 K/uL   nRBC 0.0 0.0 - 0.2 %   Neutrophils Relative % 90 %   Neutro Abs 7.1 1.7 - 7.7 K/uL   Lymphocytes Relative 4 %   Lymphs Abs 0.4 (L) 0.7 - 4.0 K/uL   Monocytes Relative 6 %   Monocytes Absolute 0.5 0.1 - 1.0 K/uL   Eosinophils Relative 0 %   Eosinophils Absolute 0.0 0.0 - 0.5 K/uL   Basophils Relative 0 %   Basophils Absolute 0.0 0.0 - 0.1 K/uL   Immature Granulocytes 0 %   Abs Immature Granulocytes 0.03 0.00 - 0.07 K/uL  Urinalysis, Routine w reflex microscopic -Urine, Clean Catch     Status: Abnormal   Collection Time: 12/04/22  2:13 PM  Result Value Ref Range   Color, Urine YELLOW YELLOW   APPearance CLEAR CLEAR   Specific Gravity, Urine 1.020 1.005 - 1.030   pH 7.5 5.0 - 8.0   Glucose, UA NEGATIVE NEGATIVE mg/dL   Hgb urine dipstick NEGATIVE NEGATIVE   Bilirubin Urine NEGATIVE NEGATIVE   Ketones, ur 15 (A) NEGATIVE mg/dL   Protein, ur 30 (A) NEGATIVE mg/dL   Nitrite NEGATIVE NEGATIVE   Leukocytes,Ua NEGATIVE NEGATIVE  Urinalysis, Microscopic (reflex)     Status: Abnormal   Collection Time: 12/04/22  2:13 PM  Result Value Ref Range   RBC / HPF 0-5 0 - 5 RBC/hpf   WBC, UA NONE SEEN 0 - 5 WBC/hpf   Bacteria, UA RARE (A) NONE SEEN   Squamous Epithelial / HPF 0-5 0 - 5 /HPF    DG Chest Portable 1 View  Result Date: 12/04/2022 CLINICAL DATA:  Shortness of breath, cough and fever. EXAM: PORTABLE CHEST 1 VIEW COMPARISON:  None Available. FINDINGS: Heart size is normal. No pleural effusion or edema. No airspace opacities identified. The visualized osseous structures are unremarkable. IMPRESSION: No acute cardiopulmonary abnormalities. Electronically Signed   By: Kerby Moors M.D.   On: 12/04/2022 14:13    MAU Course  Procedures  MDM On arrival  patient noted to be tachycardic in the 120s and febrile. O2 sats 100%. Patient appears  well. Respiratory panel collected. IV fluid bolus initiated and 1g Tylenol given. Chest x-ray is normal. Labs reassuring. Respiratory panel positive for Flu A.   After IV fluid bolus vital signs stable. HR just mildly tachycardia in the low 100s. Patient reports feeling much better. Patient afebrile at discharge. Discussed Tamiflu and patient desires to take. Will send to pharmacy. Reviewed Flu management at home, encouraged increase PO water intake. Reviewed Flu is contagious and proper hand hygiene.   Assessment and Plan   1. Supervision of other normal pregnancy, antepartum   2. [redacted] weeks gestation of pregnancy   3. Influenza A    - Discharge home in stable condition - Rx for Tamiflu. Symptom management reviewed. List of OTC meds provided - Strict return precautions. Return to MAU as needed for new/worsening symptoms - Keep OB appointment as scheduled  Renee Harder, CNM 12/04/2022, 4:23 PM

## 2022-12-05 ENCOUNTER — Encounter: Payer: Self-pay | Admitting: Obstetrics and Gynecology

## 2022-12-05 DIAGNOSIS — J101 Influenza due to other identified influenza virus with other respiratory manifestations: Secondary | ICD-10-CM | POA: Insufficient documentation

## 2022-12-09 ENCOUNTER — Encounter: Payer: Self-pay | Admitting: Student

## 2022-12-09 ENCOUNTER — Ambulatory Visit (INDEPENDENT_AMBULATORY_CARE_PROVIDER_SITE_OTHER): Payer: Medicaid Other | Admitting: Student

## 2022-12-09 VITALS — BP 115/60 | HR 81 | Wt 178.2 lb

## 2022-12-09 DIAGNOSIS — Z3480 Encounter for supervision of other normal pregnancy, unspecified trimester: Secondary | ICD-10-CM

## 2022-12-09 DIAGNOSIS — Z3A22 22 weeks gestation of pregnancy: Secondary | ICD-10-CM

## 2022-12-09 DIAGNOSIS — Z3482 Encounter for supervision of other normal pregnancy, second trimester: Secondary | ICD-10-CM

## 2022-12-09 NOTE — Progress Notes (Signed)
   PRENATAL VISIT NOTE  Subjective:  Catherine Holder is a 32 y.o. G2P1001 at 33w6dbeing seen today for ongoing prenatal care.  She is currently monitored for the following issues for this low-risk pregnancy and has Anxiety associated with birthing process; Nystagmus; Psychologic vaginismus; Supervision of other normal pregnancy, antepartum; and Influenza A on their problem list.  Patient reports  sore/itchy throat at bedtime. Patient is feeling better from recent flu diagnosis on 02/17.  Contractions: Irritability. Vag. Bleeding: None.  Movement: Present. Denies leaking of fluid.   The following portions of the patient's history were reviewed and updated as appropriate: allergies, current medications, past family history, past medical history, past social history, past surgical history and problem list.   Objective:   Vitals:   12/09/22 1354  BP: 115/60  Pulse: 81  Weight: 178 lb 3.2 oz (80.8 kg)    Fetal Status: Fetal Heart Rate (bpm): 148   Movement: Present     General:  Alert, oriented and cooperative. Patient is in no acute distress.  Skin: Skin is warm and dry. No rash noted.   Cardiovascular: Normal heart rate noted  Respiratory: Normal respiratory effort, no problems with respiration noted  Abdomen: Soft, gravid, appropriate for gestational age.  Pain/Pressure: Absent     Pelvic: Cervical exam deferred        Extremities: Normal range of motion.  Edema: None  Mental Status: Normal mood and affect. Normal behavior. Normal judgment and thought content.   Assessment and Plan:  Pregnancy: G2P1001 at 239w6d. Supervision of other normal pregnancy, antepartum - Recommend Cepacol throat lozenges  2. [redacted] weeks gestation of pregnancy - follow-up growth scan is scheduled to complete anatomy scan  Preterm labor symptoms and general obstetric precautions including but not limited to vaginal bleeding, contractions, leaking of fluid and fetal movement were reviewed in detail with the  patient. Please refer to After Visit Summary for other counseling recommendations.   Return in about 4 weeks (around 01/06/2023) for LOB, IN-PERSON.  Future Appointments  Date Time Provider [place]3/08/2023  1:30 PM [facility] [place]08[facility]2023  1:45 PM [facility] US[facility]/21/2024  1:30 PM FoJohnston EbbsNP [facility] None    NiJohnston EbbsNP

## 2022-12-09 NOTE — Progress Notes (Signed)
Pt presents for ROB visit. Pt positive flu 2/17. Requesting Rx for cough.

## 2022-12-21 ENCOUNTER — Ambulatory Visit: Payer: Medicaid Other

## 2022-12-27 ENCOUNTER — Ambulatory Visit: Payer: Medicaid Other | Attending: Maternal & Fetal Medicine

## 2022-12-27 ENCOUNTER — Ambulatory Visit: Payer: Medicaid Other

## 2022-12-27 DIAGNOSIS — O35BXX0 Maternal care for other (suspected) fetal abnormality and damage, fetal cardiac anomalies, not applicable or unspecified: Secondary | ICD-10-CM

## 2022-12-27 DIAGNOSIS — Z362 Encounter for other antenatal screening follow-up: Secondary | ICD-10-CM

## 2022-12-27 DIAGNOSIS — Z3A25 25 weeks gestation of pregnancy: Secondary | ICD-10-CM | POA: Diagnosis not present

## 2023-01-06 ENCOUNTER — Ambulatory Visit (INDEPENDENT_AMBULATORY_CARE_PROVIDER_SITE_OTHER): Payer: Medicaid Other | Admitting: Student

## 2023-01-06 ENCOUNTER — Encounter: Payer: Self-pay | Admitting: Student

## 2023-01-06 VITALS — BP 108/54 | HR 78 | Wt 178.4 lb

## 2023-01-06 DIAGNOSIS — Z3480 Encounter for supervision of other normal pregnancy, unspecified trimester: Secondary | ICD-10-CM

## 2023-01-06 DIAGNOSIS — Z3482 Encounter for supervision of other normal pregnancy, second trimester: Secondary | ICD-10-CM

## 2023-01-06 DIAGNOSIS — Z3A26 26 weeks gestation of pregnancy: Secondary | ICD-10-CM

## 2023-01-06 NOTE — Progress Notes (Signed)
   PRENATAL VISIT NOTE  Subjective:  Catherine Holder is a 32 y.o. G2P1001 at [redacted]w[redacted]d being seen today for ongoing prenatal care.  She is currently monitored for the following issues for this low-risk pregnancy and has Anxiety associated with birthing process; Nystagmus; Psychologic vaginismus; Supervision of other normal pregnancy, antepartum; and Influenza A on their problem list.  Patient reports no complaints. Occasional shortness of breath with laying down flat. Does not have chest pain, dizziness, or extreme fatigue with ambulation. Contractions: Irritability. Vag. Bleeding: None.  Movement: Present. Denies leaking of fluid.   The following portions of the patient's history were reviewed and updated as appropriate: allergies, current medications, past family history, past medical history, past social history, past surgical history and problem list.   Objective:   Vitals:   01/06/23 1336  BP: (!) 108/54  Pulse: 78  Weight: 178 lb 6.4 oz (80.9 kg)    Fetal Status: Fetal Heart Rate (bpm): 132   Movement: Present     General:  Alert, oriented and cooperative. Patient is in no acute distress.  Skin: Skin is warm and dry. No rash noted.   Cardiovascular: Normal heart rate noted  Respiratory: Normal respiratory effort, no problems with respiration noted  Abdomen: Soft, gravid, appropriate for gestational age.  Pain/Pressure: Present     Pelvic: Cervical exam deferred        Extremities: Normal range of motion.  Edema: None  Mental Status: Normal mood and affect. Normal behavior. Normal judgment and thought content.   Assessment and Plan:  Pregnancy: G2P1001 at [redacted]w[redacted]d 1. Supervision of other normal pregnancy, antepartum - Doing well, fetal movement present  2. [redacted] weeks gestation of pregnancy - gtt at next visit  Preterm labor symptoms and general obstetric precautions including but not limited to vaginal bleeding, contractions, leaking of fluid and fetal movement were reviewed in  detail with the patient. Please refer to After Visit Summary for other counseling recommendations.   Return in about 2 weeks (around 01/20/2023) for LOB/GTT, IN-PERSON.  No future appointments.  Johnston Ebbs, NP

## 2023-01-06 NOTE — Progress Notes (Signed)
Pt presents for ROB visit. No concerns.

## 2023-01-28 ENCOUNTER — Ambulatory Visit: Payer: Medicaid Other | Admitting: Obstetrics and Gynecology

## 2023-01-28 ENCOUNTER — Other Ambulatory Visit: Payer: Medicaid Other

## 2023-01-28 VITALS — BP 96/62 | HR 77 | Wt 180.0 lb

## 2023-01-28 DIAGNOSIS — O99343 Other mental disorders complicating pregnancy, third trimester: Secondary | ICD-10-CM

## 2023-01-28 DIAGNOSIS — Z23 Encounter for immunization: Secondary | ICD-10-CM | POA: Diagnosis not present

## 2023-01-28 DIAGNOSIS — F419 Anxiety disorder, unspecified: Secondary | ICD-10-CM

## 2023-01-28 DIAGNOSIS — Z3A30 30 weeks gestation of pregnancy: Secondary | ICD-10-CM | POA: Diagnosis not present

## 2023-01-28 DIAGNOSIS — Z3480 Encounter for supervision of other normal pregnancy, unspecified trimester: Secondary | ICD-10-CM

## 2023-01-28 NOTE — Progress Notes (Signed)
   PRENATAL VISIT NOTE  Subjective:  Catherine Holder is a 32 y.o. G2P1001 at [redacted]w[redacted]d being seen today for ongoing prenatal care.  She is currently monitored for the following issues for this low-risk pregnancy and has Anxiety associated with birthing process; Nystagmus; Psychologic vaginismus; Supervision of other normal pregnancy, antepartum; and Influenza A on their problem list.  Patient doing well with no acute concerns today. She reports no complaints.  Contractions: Not present. Vag. Bleeding: None.  Movement: Present. Denies leaking of fluid.   The following portions of the patient's history were reviewed and updated as appropriate: allergies, current medications, past family history, past medical history, past social history, past surgical history and problem list. Problem list updated.  Objective:   Vitals:   01/28/23 0946  BP: 96/62  Pulse: 77  Weight: 180 lb (81.6 kg)    Fetal Status: Fetal Heart Rate (bpm): 130 (Simultaneous filing. User may not have seen previous data.) Fundal Height: 30 cm Movement: Present     General:  Alert, oriented and cooperative. Patient is in no acute distress.  Skin: Skin is warm and dry. No rash noted.   Cardiovascular: Normal heart rate noted  Respiratory: Normal respiratory effort, no problems with respiration noted  Abdomen: Soft, gravid, appropriate for gestational age.  Pain/Pressure: Present     Pelvic: Cervical exam deferred        Extremities: Normal range of motion.     Mental Status:  Normal mood and affect. Normal behavior. Normal judgment and thought content.   Assessment and Plan:  Pregnancy: G2P1001 at [redacted]w[redacted]d  1. Supervision of other normal pregnancy, antepartum Continue routine prenatal care - Glucose Tolerance, 2 Hours w/1 Hour - CBC - RPR - HIV antibody (with reflex) - Tdap vaccine greater than or equal to 7yo IM  2. [redacted] weeks gestation of pregnancy   3. Anxiety associated with birthing process Pt is in good spirits  today, no concern  Preterm labor symptoms and general obstetric precautions including but not limited to vaginal bleeding, contractions, leaking of fluid and fetal movement were reviewed in detail with the patient.  Please refer to After Visit Summary for other counseling recommendations.   Return in about 2 weeks (around 02/11/2023) for ROB, in person.   Mariel Aloe, MD Faculty Attending [facility]

## 2023-01-29 LAB — GLUCOSE TOLERANCE, 2 HOURS W/ 1HR
Glucose, 1 hour: 144 mg/dL (ref 70–179)
Glucose, 2 hour: 97 mg/dL (ref 70–152)
Glucose, Fasting: 85 mg/dL (ref 70–91)

## 2023-01-29 LAB — CBC
Hematocrit: 31.5 % — ABNORMAL LOW (ref 34.0–46.6)
Hemoglobin: 10.6 g/dL — ABNORMAL LOW (ref 11.1–15.9)
MCH: 31.1 pg (ref 26.6–33.0)
MCHC: 33.7 g/dL (ref 31.5–35.7)
MCV: 92 fL (ref 79–97)
Platelets: 202 10*3/uL (ref 150–450)
RBC: 3.41 x10E6/uL — ABNORMAL LOW (ref 3.77–5.28)
RDW: 12.4 % (ref 11.7–15.4)
WBC: 6.8 10*3/uL (ref 3.4–10.8)

## 2023-01-29 LAB — HIV ANTIBODY (ROUTINE TESTING W REFLEX): HIV Screen 4th Generation wRfx: NONREACTIVE

## 2023-01-29 LAB — RPR: RPR Ser Ql: NONREACTIVE

## 2023-02-10 ENCOUNTER — Encounter: Payer: Self-pay | Admitting: Student

## 2023-02-10 ENCOUNTER — Ambulatory Visit (INDEPENDENT_AMBULATORY_CARE_PROVIDER_SITE_OTHER): Payer: Medicaid Other | Admitting: Student

## 2023-02-10 VITALS — BP 102/61 | HR 73 | Wt 181.8 lb

## 2023-02-10 DIAGNOSIS — O99340 Other mental disorders complicating pregnancy, unspecified trimester: Secondary | ICD-10-CM

## 2023-02-10 DIAGNOSIS — F525 Vaginismus not due to a substance or known physiological condition: Secondary | ICD-10-CM

## 2023-02-10 DIAGNOSIS — Z3480 Encounter for supervision of other normal pregnancy, unspecified trimester: Secondary | ICD-10-CM

## 2023-02-10 DIAGNOSIS — Z3A31 31 weeks gestation of pregnancy: Secondary | ICD-10-CM

## 2023-02-10 DIAGNOSIS — F419 Anxiety disorder, unspecified: Secondary | ICD-10-CM

## 2023-02-10 NOTE — Progress Notes (Signed)
Pt presents for ROB visit. Pt reports lower abdominal pressure and cramping. No other concerns.

## 2023-02-10 NOTE — Progress Notes (Signed)
   PRENATAL VISIT NOTE  Subjective:  Catherine Holder is a 32 y.o. G2P1001 at [redacted]w[redacted]d being seen today for ongoing prenatal care.  She is currently monitored for the following issues for this low-risk pregnancy and has Anxiety associated with birthing process; Nystagmus; Psychologic vaginismus; Supervision of other normal pregnancy, antepartum; and Influenza A on their problem list.  Patient reports no complaints.  Contractions: Irritability. Vag. Bleeding: None.  Movement: Present. Denies leaking of fluid.   The following portions of the patient's history were reviewed and updated as appropriate: allergies, current medications, past family history, past medical history, past social history, past surgical history and problem list.   Objective:   Vitals:   02/10/23 1341  BP: 102/61  Pulse: 73  Weight: 181 lb 12.8 oz (82.5 kg)    Fetal Status: Fetal Heart Rate (bpm): 144   Movement: Present     General:  Alert, oriented and cooperative. Patient is in no acute distress.  Skin: Skin is warm and dry. No rash noted.   Cardiovascular: Normal heart rate noted  Respiratory: Normal respiratory effort, no problems with respiration noted  Abdomen: Soft, gravid, appropriate for gestational age.  Pain/Pressure: Present     Pelvic: Cervical exam deferred        Extremities: Normal range of motion.  Edema: Trace  Mental Status: Normal mood and affect. Normal behavior. Normal judgment and thought content.   Assessment and Plan:  Pregnancy: G2P1001 at [redacted]w[redacted]d 1. Supervision of other normal pregnancy, antepartum - doing well, frequent and vigorous fetal movement  2. [redacted] weeks gestation of pregnancy - 3rd trimester labs normal  3. Anxiety associated with birthing process 4. Psychologic vaginismus - in good spirits  Preterm labor symptoms and general obstetric precautions including but not limited to vaginal bleeding, contractions, leaking of fluid and fetal movement were reviewed in detail with the  patient. Please refer to After Visit Summary for other counseling recommendations.   Return in about 2 weeks (around 02/24/2023) for LOB, IN-PERSON.  Future Appointments  Date Time Provider Department Center  02/24/2023  1:30 PM Anyanwu, Jethro Bastos, MD [facility] None  03/10/2023  1:30 PM Gerrit Heck, CNM [facility] None  03/17/2023  1:50 PM Marny Lowenstein, PA-C [facility] None  03/24/2023  1:30 PM Warden Fillers, MD [facility] None  03/31/2023  1:30 PM Conan Bowens, MD [facility] None  04/06/2023  1:30 PM Hermina Staggers, MD [facility] None    Corlis Hove, NP

## 2023-02-24 ENCOUNTER — Ambulatory Visit (INDEPENDENT_AMBULATORY_CARE_PROVIDER_SITE_OTHER): Payer: Medicaid Other | Admitting: Obstetrics & Gynecology

## 2023-02-24 ENCOUNTER — Encounter: Payer: Self-pay | Admitting: Obstetrics & Gynecology

## 2023-02-24 VITALS — BP 105/65 | HR 72 | Wt 185.2 lb

## 2023-02-24 DIAGNOSIS — Z3A33 33 weeks gestation of pregnancy: Secondary | ICD-10-CM

## 2023-02-24 DIAGNOSIS — Z3480 Encounter for supervision of other normal pregnancy, unspecified trimester: Secondary | ICD-10-CM

## 2023-02-24 NOTE — Patient Instructions (Signed)
Return to office for any scheduled appointments. Call the office or go to the MAU at [facility] if: You begin to have strong, frequent contractions Your water breaks.  Sometimes it is a big gush of fluid, sometimes it is just a trickle that keeps getting your underwear wet or running down your legs You have vaginal bleeding.  It is normal to have a small amount of spotting if your cervix was checked.  You do not feel your baby moving like normal.  If you do not, get something to eat and drink and lay down and focus on feeling your baby move.   If your baby is still not moving like normal, you should call the office or go to MAU. Any other obstetric concerns.  

## 2023-02-24 NOTE — Progress Notes (Signed)
   PRENATAL VISIT NOTE  Subjective:  Catherine Holder is a 32 y.o. G2P1001 at [redacted]w[redacted]d being seen today for ongoing prenatal care.  She is currently monitored for the following issues for this low-risk pregnancy and has Anxiety associated with birthing process; Nystagmus; Psychologic vaginismus; Supervision of other normal pregnancy, antepartum; and Influenza A on their problem list.  Patient reports no complaints.  Contractions: Not present. Vag. Bleeding: None.  Movement: Present. Denies leaking of fluid.   The following portions of the patient's history were reviewed and updated as appropriate: allergies, current medications, past family history, past medical history, past social history, past surgical history and problem list.   Objective:   Vitals:   02/24/23 1331  BP: 105/65  Pulse: 72  Weight: 185 lb 3.2 oz (84 kg)    Fetal Status: Fetal Heart Rate (bpm): 154 Fundal Height: 33 cm Movement: Present     General:  Alert, oriented and cooperative. Patient is in no acute distress.  Skin: Skin is warm and dry. No rash noted.   Cardiovascular: Normal heart rate noted  Respiratory: Normal respiratory effort, no problems with respiration noted  Abdomen: Soft, gravid, appropriate for gestational age.  Pain/Pressure: Present     Pelvic: Cervical exam deferred        Extremities: Normal range of motion.  Edema: Trace  Mental Status: Normal mood and affect. Normal behavior. Normal judgment and thought content.   Assessment and Plan:  Pregnancy: G2P1001 at [redacted]w[redacted]d 1. [redacted] weeks gestation of pregnancy 2. Supervision of other normal pregnancy, antepartum No concerns. Preterm labor symptoms and general obstetric precautions including but not limited to vaginal bleeding, contractions, leaking of fluid and fetal movement were reviewed in detail with the patient. Please refer to After Visit Summary for other counseling recommendations.   Return in about 2 weeks (around 03/10/2023) for OFFICE OB VISIT  (MD or APP).  Future Appointments  Date Time Provider Department Center  03/10/2023  1:10 PM Gerrit Heck, CNM [facility] None  03/17/2023  1:10 PM Marny Lowenstein, PA-C [facility] None  03/23/2023  1:10 PM Lennart Pall, MD [facility] None  03/31/2023  1:10 PM Conan Bowens, MD [facility] None  04/06/2023  1:10 PM Corlis Hove, NP [facility] None    Jaynie Collins, MD

## 2023-03-10 ENCOUNTER — Other Ambulatory Visit (HOSPITAL_COMMUNITY)
Admission: RE | Admit: 2023-03-10 | Discharge: 2023-03-10 | Disposition: A | Discharge status: Home / Self Care | Payer: Medicaid Other | Source: Ambulatory Visit

## 2023-03-10 ENCOUNTER — Ambulatory Visit (INDEPENDENT_AMBULATORY_CARE_PROVIDER_SITE_OTHER): Payer: Medicaid Other

## 2023-03-10 VITALS — BP 105/60 | HR 74 | Wt 185.0 lb

## 2023-03-10 DIAGNOSIS — Z3A35 35 weeks gestation of pregnancy: Secondary | ICD-10-CM

## 2023-03-10 DIAGNOSIS — Z3480 Encounter for supervision of other normal pregnancy, unspecified trimester: Secondary | ICD-10-CM | POA: Insufficient documentation

## 2023-03-10 DIAGNOSIS — R0602 Shortness of breath: Secondary | ICD-10-CM

## 2023-03-10 DIAGNOSIS — O26899 Other specified pregnancy related conditions, unspecified trimester: Secondary | ICD-10-CM

## 2023-03-10 NOTE — Progress Notes (Signed)
   LOW-RISK PREGNANCY OFFICE VISIT  Patient name: Catherine Holder MRN [id number]  Date of birth: 08-Aug-1991 Chief Complaint:   Routine Prenatal Visit  Subjective:   Catherine Holder is a 32 y.o. G65P1001 female at [redacted]w[redacted]d with an Estimated Date of Delivery: 04/08/23 being seen today for ongoing management of a low-risk pregnancy aeb has Anxiety associated with birthing process; Nystagmus; Psychologic vaginismus; Supervision of other normal pregnancy, antepartum; and Influenza A on their problem list.  Patient presents today, alone, with  SOB .  She states the SOB is present with laying down and improved with sitting up and/or standing.  She also reports relief with cold/cool air. Patient denies h/o asthma.   Patient endorses fetal movement. Patient reports abdominal cramping or contractions.  Patient denies vaginal concerns including abnormal discharge, leaking of fluid, and bleeding. No issues with urination, constipation, or diarrhea.    Contractions: Not present. Vag. Bleeding: None.  Movement: Present.  Reviewed past medical,surgical, social, obstetrical and family history as well as problem list, medications and allergies.  Objective   Vitals:   03/10/23 1322  BP: 105/60  Pulse: 74  Weight: 185 lb (83.9 kg)  Body mass index is 33.84 kg/m.  Total Weight Gain:32 lb 14.1 oz (14.9 kg)         Physical Examination:   General appearance: Well appearing, and in no distress  Mental status: Alert, oriented to person, place, and time  Skin: Warm & dry  Cardiovascular: Normal heart rate noted  Respiratory: Normal respiratory effort, no distress  Abdomen: Soft, gravid, nontender, AGA with Fundal Height: 37 cm  Pelvic: Cervical exam deferred           Extremities: Edema: Trace  Fetal Status: Fetal Heart Rate (bpm): 135  Movement: Present   No results found for this or any previous visit (from the past 24 hour(s)).  Assessment & Plan:  Low-risk pregnancy of a 32 y.o., G2P1001 at [redacted]w[redacted]d with  an Estimated Date of Delivery: 04/08/23   1. Supervision of other normal pregnancy, antepartum -Anticipatory guidance for upcoming appts. -Patient to schedule next appt in 1 weeks for an in-person visit. -GBS collected.  2. [redacted] weeks gestation of pregnancy -Doing well overall. -Anticipating female infant. Unsure of circumcision.  -Plan to breastfeed.   3. Shortness of breath with pregnancy -Reviewed complaint. -Discussed concerns for asthma.  -Reassured likely pregnancy induced with gravid uterus. -Patient instructed to monitor and report worsening of symptoms.     Meds: No orders of the defined types were placed in this encounter.  Labs/procedures today:  Lab Orders  No laboratory test(s) ordered today     Reviewed: Preterm labor symptoms and general obstetric precautions including but not limited to vaginal bleeding, contractions, leaking of fluid and fetal movement were reviewed in detail with the patient.  All questions were answered.  Follow-up: Return in about 1 week (around 03/17/2023) for LROB.  No orders of the defined types were placed in this encounter.  Cherre Robins MSN, CNM 03/10/2023

## 2023-03-10 NOTE — Progress Notes (Signed)
rob

## 2023-03-10 NOTE — Addendum Note (Signed)
Addended by: Jearld Adjutant on: 03/10/2023 02:57 PM   Modules accepted: Orders

## 2023-03-11 LAB — CERVICOVAGINAL ANCILLARY ONLY
Bacterial Vaginitis (gardnerella): POSITIVE — AB
Candida Glabrata: NEGATIVE
Candida Vaginitis: NEGATIVE
Chlamydia: NEGATIVE
Comment: NEGATIVE
Comment: NEGATIVE
Comment: NEGATIVE
Comment: NEGATIVE
Comment: NEGATIVE
Comment: NORMAL
Neisseria Gonorrhea: NEGATIVE
Trichomonas: NEGATIVE

## 2023-03-14 LAB — CULTURE, BETA STREP (GROUP B ONLY): Strep Gp B Culture: NEGATIVE

## 2023-03-17 ENCOUNTER — Encounter: Payer: Medicaid Other | Admitting: Medical

## 2023-03-17 ENCOUNTER — Encounter: Payer: Self-pay | Admitting: Medical

## 2023-03-17 ENCOUNTER — Ambulatory Visit (INDEPENDENT_AMBULATORY_CARE_PROVIDER_SITE_OTHER): Payer: Medicaid Other | Admitting: Medical

## 2023-03-17 VITALS — BP 108/66 | HR 72 | Wt 186.5 lb

## 2023-03-17 DIAGNOSIS — O99340 Other mental disorders complicating pregnancy, unspecified trimester: Secondary | ICD-10-CM

## 2023-03-17 DIAGNOSIS — O99343 Other mental disorders complicating pregnancy, third trimester: Secondary | ICD-10-CM

## 2023-03-17 DIAGNOSIS — Z3480 Encounter for supervision of other normal pregnancy, unspecified trimester: Secondary | ICD-10-CM

## 2023-03-17 DIAGNOSIS — Z3A36 36 weeks gestation of pregnancy: Secondary | ICD-10-CM

## 2023-03-17 DIAGNOSIS — F419 Anxiety disorder, unspecified: Secondary | ICD-10-CM

## 2023-03-17 NOTE — Progress Notes (Signed)
Pt reports fetal movement with occasional pressure.  

## 2023-03-17 NOTE — Progress Notes (Signed)
   PRENATAL VISIT NOTE  Subjective:  Catherine Holder is a 32 y.o. G2P1001 at [redacted]w[redacted]d being seen today for ongoing prenatal care.  She is currently monitored for the following issues for this low-risk pregnancy and has Anxiety associated with birthing process; Nystagmus; Psychologic vaginismus; Supervision of other normal pregnancy, antepartum; and Influenza A on their problem list.  Patient reports occasional contractions.  Contractions: Not present. Vag. Bleeding: None.  Movement: Present. Denies leaking of fluid.   The following portions of the patient's history were reviewed and updated as appropriate: allergies, current medications, past family history, past medical history, past social history, past surgical history and problem list.   Objective:   Vitals:   03/17/23 1327  BP: 108/66  Pulse: 72  Weight: 186 lb 8 oz (84.6 kg)    Fetal Status: Fetal Heart Rate (bpm): 148 Fundal Height: 36 cm Movement: Present     General:  Alert, oriented and cooperative. Patient is in no acute distress.  Skin: Skin is warm and dry. No rash noted.   Cardiovascular: Normal heart rate noted  Respiratory: Normal respiratory effort, no problems with respiration noted  Abdomen: Soft, gravid, appropriate for gestational age.  Pain/Pressure: Present     Pelvic: Cervical exam deferred        Extremities: Normal range of motion.  Edema: Trace  Mental Status: Normal mood and affect. Normal behavior. Normal judgment and thought content.   Assessment and Plan:  Pregnancy: G2P1001 at [redacted]w[redacted]d 1. Supervision of other normal pregnancy, antepartum - GBS, GC/CT at last visit normal and reviewed today   2. Anxiety associated with birthing process  3. [redacted] weeks gestation of pregnancy  Preterm labor symptoms and general obstetric precautions including but not limited to vaginal bleeding, contractions, leaking of fluid and fetal movement were reviewed in detail with the patient. Please refer to After Visit Summary  for other counseling recommendations.   Return in about 1 week (around 03/24/2023) for LOB, any provider, In-Person.  Future Appointments  Date Time Provider Department Center  03/23/2023  1:10 PM Lennart Pall, MD [facility] None  03/31/2023  1:10 PM Conan Bowens, MD [facility] None  04/06/2023  1:10 PM Corlis Hove, NP [facility] None    Vonzella Nipple, PA-C

## 2023-03-23 ENCOUNTER — Encounter: Payer: Medicaid Other | Admitting: Obstetrics and Gynecology

## 2023-03-24 ENCOUNTER — Encounter: Payer: Medicaid Other | Admitting: Obstetrics and Gynecology

## 2023-03-31 ENCOUNTER — Encounter: Payer: Medicaid Other | Admitting: Obstetrics and Gynecology

## 2023-03-31 ENCOUNTER — Ambulatory Visit (INDEPENDENT_AMBULATORY_CARE_PROVIDER_SITE_OTHER): Payer: Medicaid Other | Admitting: Obstetrics and Gynecology

## 2023-03-31 VITALS — BP 107/69 | HR 67 | Wt 188.0 lb

## 2023-03-31 DIAGNOSIS — E669 Obesity, unspecified: Secondary | ICD-10-CM

## 2023-03-31 DIAGNOSIS — Z3483 Encounter for supervision of other normal pregnancy, third trimester: Secondary | ICD-10-CM

## 2023-03-31 DIAGNOSIS — Z3480 Encounter for supervision of other normal pregnancy, unspecified trimester: Secondary | ICD-10-CM

## 2023-03-31 DIAGNOSIS — Z3A38 38 weeks gestation of pregnancy: Secondary | ICD-10-CM

## 2023-03-31 NOTE — Progress Notes (Addendum)
   PRENATAL VISIT NOTE  Subjective:  Catherine Holder is a 32 y.o. G2P1001 at [redacted]w[redacted]d being seen today for ongoing prenatal care.  She is currently monitored for the following issues for this low-risk pregnancy and has Anxiety associated with birthing process; Nystagmus; Psychologic vaginismus; Supervision of other normal pregnancy, antepartum; and Influenza A on their problem list.  Patient reports no complaints.  Contractions: Irregular. Vag. Bleeding: None.  Movement: Present. Denies leaking of fluid.   The following portions of the patient's history were reviewed and updated as appropriate: allergies, current medications, past family history, past medical history, past social history, past surgical history and problem list.   Objective:   Vitals:   03/31/23 1340  BP: 107/69  Pulse: 67  Weight: 188 lb (85.3 kg)    Fetal Status: Fetal Heart Rate (bpm): 154 Fundal Height: 38 cm Movement: Present     General:  Alert, oriented and cooperative. Patient is in no acute distress.  Skin: Skin is warm and dry. No rash noted.   Cardiovascular: Normal heart rate noted  Respiratory: Normal respiratory effort, no problems with respiration noted  Abdomen: Soft, gravid, appropriate for gestational age.  Pain/Pressure: Present     Pelvic: Cervical exam deferred        Extremities: Normal range of motion.  Edema: Trace  Mental Status: Normal mood and affect. Normal behavior. Normal judgment and thought content.   [place]: cephalic Assessment and Plan:  Pregnancy: G2P1001 at [redacted]w[redacted]d  1. [redacted] weeks gestation of pregnancy Plan for induction at 40+ weeks, scheduled today  2. Supervision of other normal pregnancy, antepartum  3. Obesity (BMI 30-39.9)   Term labor symptoms and general obstetric precautions including but not limited to vaginal bleeding, contractions, leaking of fluid and fetal movement were reviewed in detail with the patient. Please refer to After Visit Summary for other counseling  recommendations.   Return in about 1 week (around 04/07/2023) for low OB.  Future Appointments  Date Time Provider Department Center  04/06/2023  1:10 PM Corlis Hove, NP [facility] None    Conan Bowens, MD

## 2023-04-06 ENCOUNTER — Encounter: Payer: Medicaid Other | Admitting: Obstetrics and Gynecology

## 2023-04-06 ENCOUNTER — Ambulatory Visit (INDEPENDENT_AMBULATORY_CARE_PROVIDER_SITE_OTHER): Payer: Medicaid Other | Admitting: Student

## 2023-04-06 VITALS — BP 121/72 | HR 73 | Wt 187.0 lb

## 2023-04-06 DIAGNOSIS — F525 Vaginismus not due to a substance or known physiological condition: Secondary | ICD-10-CM

## 2023-04-06 DIAGNOSIS — Z3480 Encounter for supervision of other normal pregnancy, unspecified trimester: Secondary | ICD-10-CM

## 2023-04-06 DIAGNOSIS — Z3A39 39 weeks gestation of pregnancy: Secondary | ICD-10-CM

## 2023-04-06 NOTE — Progress Notes (Signed)
   PRENATAL VISIT NOTE  Subjective:  Catherine Holder is a 32 y.o. G2P1001 at [redacted]w[redacted]d being seen today for ongoing prenatal care.  She is currently monitored for the following issues for this low-risk pregnancy and has Anxiety associated with birthing process; Nystagmus; Psychologic vaginismus; Supervision of other normal pregnancy, antepartum; and Influenza A on their problem list.  Patient reports no complaints. Desires cervical exam today. Contractions: Irritability. Vag. Bleeding: None.  Movement: Present. Denies leaking of fluid.   The following portions of the patient's history were reviewed and updated as appropriate: allergies, current medications, past family history, past medical history, past social history, past surgical history and problem list.   Objective:   Vitals:   04/06/23 1315  BP: 121/72  Pulse: 73  Weight: 187 lb (84.8 kg)    Fetal Status: Fetal Heart Rate (bpm): 136   Movement: Present     General:  Alert, oriented and cooperative. Patient is in no acute distress.  Skin: Skin is warm and dry. No rash noted.   Cardiovascular: Normal heart rate noted  Respiratory: Normal respiratory effort, no problems with respiration noted  Abdomen: Soft, gravid, appropriate for gestational age.  Pain/Pressure: Present     Pelvic: Cervical exam performed in the presence of a chaperone Dilation: Closed Effacement (%): 10, 20 Station: -3, Posterior Cervix  Extremities: Normal range of motion.  Edema: Trace  Mental Status: Normal mood and affect. Normal behavior. Normal judgment and thought content.   Assessment and Plan:  Pregnancy: G2P1001 at [redacted]w[redacted]d 1. Supervision of other normal pregnancy, antepartum - Frequent and vigorous movement  2. [redacted] weeks gestation of pregnancy - IOL scheduled  - Discussed recommendation for antenatal testing next week for postdates  3. Psychologic vaginismus - Support provided today. Patient is amendable to any required cervical exams and would  really appreciate pharmacological support in hospital to support comfort   Term labor symptoms and general obstetric precautions including but not limited to vaginal bleeding, contractions, leaking of fluid and fetal movement were reviewed in detail with the patient. Please refer to After Visit Summary for other counseling recommendations.   Return in about 5 days (around 04/11/2023) for NST for Post-dates , IN-PERSON, LOB.  Future Appointments  Date Time Provider Department Center  04/11/2023  9:35 AM Corlis Hove, NP [facility] None  04/15/2023  7:15 AM [facility] SCHED ROOM [facility] None    Corlis Hove, NP

## 2023-04-06 NOTE — Progress Notes (Signed)
Pt presents for ROB visit. Requesting cervical check. No other concerns.  

## 2023-04-07 ENCOUNTER — Inpatient Hospital Stay (HOSPITAL_COMMUNITY)
Admission: AD | Admit: 2023-04-07 | Discharge: 2023-04-09 | DRG: 807 | Disposition: A | Discharge status: Home / Self Care | Payer: Medicaid Other | Attending: Family Medicine | Admitting: Family Medicine

## 2023-04-07 ENCOUNTER — Encounter (HOSPITAL_COMMUNITY): Payer: Self-pay | Admitting: Family Medicine

## 2023-04-07 DIAGNOSIS — Z3A39 39 weeks gestation of pregnancy: Secondary | ICD-10-CM

## 2023-04-07 DIAGNOSIS — O4292 Full-term premature rupture of membranes, unspecified as to length of time between rupture and onset of labor: Principal | ICD-10-CM | POA: Diagnosis present

## 2023-04-07 DIAGNOSIS — Z30430 Encounter for insertion of intrauterine contraceptive device: Secondary | ICD-10-CM | POA: Diagnosis not present

## 2023-04-07 DIAGNOSIS — Z3480 Encounter for supervision of other normal pregnancy, unspecified trimester: Secondary | ICD-10-CM

## 2023-04-07 DIAGNOSIS — O4202 Full-term premature rupture of membranes, onset of labor within 24 hours of rupture: Secondary | ICD-10-CM | POA: Diagnosis not present

## 2023-04-07 DIAGNOSIS — Z3A40 40 weeks gestation of pregnancy: Secondary | ICD-10-CM | POA: Diagnosis not present

## 2023-04-07 DIAGNOSIS — O9902 Anemia complicating childbirth: Secondary | ICD-10-CM | POA: Diagnosis present

## 2023-04-07 DIAGNOSIS — O4290 Premature rupture of membranes, unspecified as to length of time between rupture and onset of labor, unspecified weeks of gestation: Principal | ICD-10-CM | POA: Diagnosis present

## 2023-04-07 LAB — CBC
HCT: 30.7 % — ABNORMAL LOW (ref 36.0–46.0)
Hemoglobin: 9.9 g/dL — ABNORMAL LOW (ref 12.0–15.0)
MCH: 27.9 pg (ref 26.0–34.0)
MCHC: 32.2 g/dL (ref 30.0–36.0)
MCV: 86.5 fL (ref 80.0–100.0)
Platelets: 188 10*3/uL (ref 150–400)
RBC: 3.55 MIL/uL — ABNORMAL LOW (ref 3.87–5.11)
RDW: 13.2 % (ref 11.5–15.5)
WBC: 6.6 10*3/uL (ref 4.0–10.5)
nRBC: 0 % (ref 0.0–0.2)

## 2023-04-07 LAB — TYPE AND SCREEN
ABO/RH(D): A POS
Antibody Screen: NEGATIVE

## 2023-04-07 LAB — POCT FERN TEST: POCT Fern Test: NEGATIVE

## 2023-04-07 LAB — RUPTURE OF MEMBRANE (ROM)PLUS: Rom Plus: POSITIVE

## 2023-04-07 MED ORDER — OXYTOCIN-SODIUM CHLORIDE 30-0.9 UT/500ML-% IV SOLN: 2.5000 [IU]/h | INTRAVENOUS | Status: DC

## 2023-04-07 MED ORDER — ONDANSETRON HCL 4 MG/2ML IJ SOLN: 4.0000 mg | Freq: Four times a day (QID) | INTRAMUSCULAR | Status: DC | PRN

## 2023-04-07 MED ORDER — FENTANYL CITRATE (PF) 100 MCG/2ML IJ SOLN
50.0000 ug | INTRAMUSCULAR | Status: DC | PRN
  Administered 2023-04-07: 50 ug via INTRAVENOUS
  Filled 2023-04-07: qty 2

## 2023-04-07 MED ORDER — ACETAMINOPHEN 325 MG PO TABS: 650.0000 mg | ORAL_TABLET | ORAL | Status: DC | PRN

## 2023-04-07 MED ORDER — PARAGARD INTRAUTERINE COPPER IU IUD
1.0000 | INTRAUTERINE_SYSTEM | Freq: Once | INTRAUTERINE | Status: AC
  Administered 2023-04-08: 1 via INTRAUTERINE
  Filled 2023-04-07: qty 1

## 2023-04-07 MED ORDER — FLEET ENEMA 7-19 GM/118ML RE ENEM: 1.0000 | ENEMA | RECTAL | Status: DC | PRN

## 2023-04-07 MED ORDER — LIDOCAINE HCL (PF) 1 % IJ SOLN
30.0000 mL | INTRAMUSCULAR | Status: AC | PRN
  Administered 2023-04-08: 30 mL via SUBCUTANEOUS
  Filled 2023-04-07: qty 30

## 2023-04-07 MED ORDER — LACTATED RINGERS IV SOLN: 500.0000 mL | INTRAVENOUS | Status: DC | PRN

## 2023-04-07 MED ORDER — OXYTOCIN BOLUS FROM INFUSION
333.0000 mL | Freq: Once | INTRAVENOUS | Status: AC
  Administered 2023-04-08: 333 mL via INTRAVENOUS

## 2023-04-07 MED ORDER — SOD CITRATE-CITRIC ACID 500-334 MG/5ML PO SOLN: 30.0000 mL | ORAL | Status: DC | PRN

## 2023-04-07 MED ORDER — OXYCODONE-ACETAMINOPHEN 5-325 MG PO TABS: 1.0000 | ORAL_TABLET | ORAL | Status: DC | PRN

## 2023-04-07 MED ORDER — LACTATED RINGERS IV SOLN: INTRAVENOUS | Status: DC

## 2023-04-07 MED ORDER — OXYCODONE-ACETAMINOPHEN 5-325 MG PO TABS: 2.0000 | ORAL_TABLET | ORAL | Status: DC | PRN

## 2023-04-07 NOTE — MAU Note (Signed)
Pt informed that the ultrasound is considered a limited OB ultrasound and is not intended to be a complete ultrasound exam.  Patient also informed that the ultrasound is not being completed with the intent of assessing for fetal or placental anomalies or any pelvic abnormalities.  Explained that the purpose of today's ultrasound is to assess for presentation.  Patient acknowledges the purpose of the exam and the limitations of the study.    Vertex presentation confirmed by bedside ultrasound.

## 2023-04-07 NOTE — MAU Provider Note (Signed)
History     CSN: [id number]  Arrival date and time: 04/07/23 1504   None     Chief Complaint  Patient presents with   Rupture of Membranes   HPI Patient presenting for concern for rupture membranes.  Reports that this morning she noticed leaking of fluids then it stopped.  Felt like it was not urine.  It has not continued to leak throughout the day.  Denies any regular contractions.  Reports good fetal movement.  Denies any vaginal bleeding.  OB History     Gravida  2   Para  1   Term  1   Preterm      AB      Living  1      SAB      IAB      Ectopic      Multiple  0   Live Births  1           Past Medical History:  Diagnosis Date   Medical history non-contributory     History reviewed. No pertinent surgical history.  Family History  Problem Relation Age of Onset   Cancer Mother    Cancer Maternal Grandfather     Social History   Tobacco Use   Smoking status: Never   Smokeless tobacco: Never  Vaping Use   Vaping Use: Never used  Substance Use Topics   Alcohol use: Never   Drug use: Never    Allergies: No Known Allergies  Medications Prior to Admission  Medication Sig Dispense Refill Last Dose   Blood Pressure Monitoring (BLOOD PRESSURE KIT) DEVI 1 kit by Does not apply route once a week. Check Blood Pressure regularly and record readings into the Babyscripts App.  Large Cuff.  DX O90.0 1 each 0    oseltamivir (TAMIFLU) 75 MG capsule Take 1 capsule (75 mg total) by mouth every 12 (twelve) hours. (Patient not taking: Reported on 01/06/2023) 10 capsule 0    Prenat-Fe Poly-Methfol-FA-DHA (VITAFOL ULTRA) 29-0.6-0.4-200 MG CAPS Take 1 capsule by mouth daily. 30 capsule 11     Review of Systems  Constitutional:  Negative for chills and fever.  Gastrointestinal:  Negative for abdominal pain, diarrhea, nausea and rectal pain.  Genitourinary:  Positive for vaginal discharge. Negative for vaginal bleeding.  All other systems reviewed and are  negative.  Physical Exam   Blood pressure 116/70, pulse 79, temperature 98.8 F (37.1 C), temperature source Oral, resp. rate 14, last menstrual period 07/10/2022, SpO2 98 %, currently breastfeeding.  Physical Exam Vitals reviewed.  Constitutional:      Appearance: Normal appearance.  HENT:     Head: Normocephalic and atraumatic.  Eyes:     Extraocular Movements: Extraocular movements intact.  Cardiovascular:     Rate and Rhythm: Normal rate.     Pulses: Normal pulses.  Pulmonary:     Effort: Pulmonary effort is normal.  Abdominal:     Comments: gravid  Musculoskeletal:        General: Normal range of motion.  Skin:    General: Skin is warm.  Neurological:     General: No focal deficit present.     Mental Status: She is alert.  Psychiatric:        Mood and Affect: Mood normal.     MAU Course  Procedures  MDM Fern  ROM+  Assessment and Plan  Catherine Holder is a 32 yo G2, P1 presenting for rupture membranes.  Fern negative, ROM plus positive.  Patient  will be admitted to labor and delivery. Spoke with labor and delivery provider.  They will place orders.  Celedonio Savage 04/07/2023, 5:15 PM

## 2023-04-07 NOTE — MAU Note (Signed)
RN explained to the patient that with her presentation of leaking fluids the recommended first step is to collect a Fern swab. RN explained to the patient what this entailed. Patient stated she would be fine with the q-tip placement and reported she knows it would be uncomfortable. RN asked the patient if she needed any prior medication to calm her anxiety as noted in her chart and the patient declined. Patient requested to have a washcloth to "bite" during q-tip collection.

## 2023-04-07 NOTE — H&P (Signed)
Marland KitchenOBSTETRIC ADMISSION HISTORY AND PHYSICAL  Catherine Holder is a 32 y.o. female G2P1001 with IUP at [redacted]w[redacted]d by LMP presenting for [facility] 0815 6/20. She reports +FMs, No LOF, no VB, no blurry vision, headaches or peripheral edema, and RUQ pain.  She plans on breast feeding. She request paraguard IUD for birth control. She received her prenatal care at [facility]  Dating: By LMP --->  Estimated Date of Delivery: 04/08/23  Sono:    @[redacted]w[redacted]d , CWD, normal anatomy, breech presentation, 918g, 77% EFW   Prenatal History/Complications:  Patient Active Problem List   Diagnosis Date Noted   PROM (premature rupture of membranes) 04/07/2023   Influenza A 12/05/2022   Supervision of other normal pregnancy, antepartum 10/22/2022   Psychologic vaginismus 08/05/2020   Anxiety associated with birthing process 03/27/2020   Nystagmus 03/27/2020    Nursing Staff Provider  Office Location [facility] Dating  04/16/2023, by Last Menstrual Period  [facility] Model Arly.Keller ] Traditional [ ]  Centering [ ]  Mom-Baby Dyad    Language  English Anatomy US    Flu Vaccine  Declined 1/5 Genetic/Carrier Screen  NIPS:   low risk female AFP:   neg Horizon: neg  TDaP Vaccine   01/28/23 Hgb A1C or  GTT Early  Third trimester : normal  COVID Vaccine Not vaccinated   LAB RESULTS   Rhogam  --/--/A POS (06/20 1755)  Blood Type --/--/A POS (06/20 1755)   Baby Feeding Plan Breast Antibody NEG (06/20 1755)  Contraception Non hormonal IUD Rubella 7.84 (01/05 1103)  Circumcision Undecided on 5/9 RPR Non Reactive (04/12 0942)   Pediatrician  [facility] HBsAg Negative (01/05 1103)   Support Person Husband-Adel HCVAb Non Reactive (01/05 1103)   Prenatal Classes  HIV Non Reactive (04/12 0942)     BTL Consent  GBS Negative/-- (05/23 1534) (For PCN allergy, check sensitivities)   VBAC Consent  Pap Diagnosis  Date Value Ref Range Status  08/05/2020   Final   - Negative for intraepithelial lesion or malignancy (NILM)         DME Rx Arly.Keller  ] BP cuff Arly.Keller ] Weight Scale Waterbirth  [ ]  Class [ ]  Consent [ ]  CNM visit  PHQ9 & GAD7 [ X ] new OB [X]  28 weeks  [ x ] 36 weeks Induction  [ ]  Orders Entered [ ] Foley Y/N     Past Medical History: Past Medical History:  Diagnosis Date   Medical history non-contributory     Past Surgical History: History reviewed. No pertinent surgical history.  Obstetrical History: OB History     Gravida  2   Para  1   Term  1   Preterm      AB      Living  1      SAB      IAB      Ectopic      Multiple  0   Live Births  1           Social History Social History   Socioeconomic History   Marital status: Married    Spouse name: Not on file   Number of children: Not on file   Years of education: Not on file   Highest education level: Not on file  Occupational History   Not on file  Tobacco Use   Smoking status: Never   Smokeless tobacco: Never  Vaping Use   Vaping Use: Never used  Substance and Sexual Activity   Alcohol  use: Never   Drug use: Never   Sexual activity: Yes    Birth control/protection: None  Other Topics Concern   Not on file  Social History Narrative   Not on file   Social Determinants of Health   Financial Resource Strain: Not on file  Food Insecurity: No Food Insecurity (04/07/2023)   Hunger Vital Sign    Worried About Running Out of Food in the Last Year: Never true    Ran Out of Food in the Last Year: Never true  Transportation Needs: No Transportation Needs (04/07/2023)   PRAPARE - Administrator, Civil Service (Medical): No    Lack of Transportation (Non-Medical): No  Physical Activity: Not on file  Stress: Not on file  Social Connections: Not on file    Family History: Family History  Problem Relation Age of Onset   Cancer Mother    Cancer Maternal Grandfather     Allergies: No Known Allergies  Medications Prior to Admission  Medication Sig Dispense Refill Last Dose   Prenat-Fe Poly-Methfol-FA-DHA  (VITAFOL ULTRA) 29-0.6-0.4-200 MG CAPS Take 1 capsule by mouth daily. 30 capsule 11 04/06/2023   Blood Pressure Monitoring (BLOOD PRESSURE KIT) DEVI 1 kit by Does not apply route once a week. Check Blood Pressure regularly and record readings into the Babyscripts App.  Large Cuff.  DX O90.0 1 each 0 Unknown   oseltamivir (TAMIFLU) 75 MG capsule Take 1 capsule (75 mg total) by mouth every 12 (twelve) hours. (Patient not taking: Reported on 01/06/2023) 10 capsule 0 Unknown     Review of Systems   All systems reviewed and negative except as stated in HPI  Blood pressure (!) 110/57, pulse 65, temperature 98.2 F (36.8 C), temperature source Oral, resp. rate 16, last menstrual period 07/10/2022, SpO2 98 %, currently breastfeeding. General appearance: alert, cooperative, and appears stated age Lungs: clear to auscultation bilaterally Heart: regular rate and rhythm Abdomen: soft, non-tender; bowel sounds normal Pelvic: no lesions Extremities: Homans sign is negative, no sign of DVT  Presentation: cephalic Fetal monitoringBaseline: 150  bpm, Variability: Good {> 6 bpm), Accelerations: Reactive, and Decelerations: Absent Uterine activity q2-3 min Dilation: 3 Effacement (%): 60 Station: -3 Exam by:: Mercado-Ortiz,MD   Prenatal labs: ABO, Rh: --/--/A POS (06/20 1755) Antibody: NEG (06/20 1755) Rubella: 7.84 (01/05 1103) RPR: Non Reactive (04/12 0942)  HBsAg: Negative (01/05 1103)  HIV: Non Reactive (04/12 0942)  GBS: Negative/-- (05/23 1534)  1 hr Glucola nml Genetic screening  nml Anatomy US nml  Prenatal Transfer Tool  Maternal Diabetes: No Genetic Screening: Normal Maternal Ultrasounds/Referrals: Normal Fetal Ultrasounds or other Referrals:  None Maternal Substance Abuse:  No Significant Maternal Medications:  None Significant Maternal Lab Results:  Group B Strep negative Number of Prenatal Visits:greater than 3 verified prenatal visits Other Comments:  None  Results for  orders placed or performed during the hospital encounter of 04/07/23 (from the past 24 hour(s))  Fern Test   Collection Time: 04/07/23  4:00 PM  Result Value Ref Range   POCT Fern Test Negative = intact amniotic membranes   Rupture of Membrane (ROM) Plus   Collection Time: 04/07/23  4:21 PM  Result Value Ref Range   Rom Plus POSITIVE   CBC   Collection Time: 04/07/23  5:55 PM  Result Value Ref Range   WBC 6.6 4.0 - 10.5 K/uL   RBC 3.55 (L) 3.87 - 5.11 MIL/uL   Hemoglobin 9.9 (L) 12.0 - 15.0 g/dL   HCT  30.7 (L) 36.0 - 46.0 %   MCV 86.5 80.0 - 100.0 fL   MCH 27.9 26.0 - 34.0 pg   MCHC 32.2 30.0 - 36.0 g/dL   RDW 81.1 91.4 - 78.2 %   Platelets 188 150 - 400 K/uL   nRBC 0.0 0.0 - 0.2 %  Type and screen [facility]   Collection Time: 04/07/23  5:55 PM  Result Value Ref Range   ABO/RH(D) A POS    Antibody Screen NEG    Sample Expiration      04/10/2023,2359 Performed at [facility], [address] [place], [place] [address]     Patient Active Problem List   Diagnosis Date Noted   PROM (premature rupture of membranes) 04/07/2023   Influenza A 12/05/2022   Supervision of other normal pregnancy, antepartum 10/22/2022   Psychologic vaginismus 08/05/2020   Anxiety associated with birthing process 03/27/2020   Nystagmus 03/27/2020    Assessment/Plan:  Tomika Sorto is a 32 y.o. G2P1001 at [redacted]w[redacted]d here for [facility] 0815 6/20  #Labor: expectant management #Pain: Per pt request #FWB: CAT 1 #ID:  GBS neg #MOF: Breast #MOC: paraguard IUD pp #Circ:  Declined  Myrtie Hawk, DO  04/07/2023, 9:53 PM

## 2023-04-08 ENCOUNTER — Inpatient Hospital Stay (HOSPITAL_COMMUNITY): Payer: Medicaid Other | Admitting: Anesthesiology

## 2023-04-08 ENCOUNTER — Other Ambulatory Visit: Payer: Self-pay

## 2023-04-08 ENCOUNTER — Encounter (HOSPITAL_COMMUNITY): Payer: Self-pay | Admitting: Family Medicine

## 2023-04-08 DIAGNOSIS — Z30430 Encounter for insertion of intrauterine contraceptive device: Secondary | ICD-10-CM

## 2023-04-08 DIAGNOSIS — Z3A40 40 weeks gestation of pregnancy: Secondary | ICD-10-CM | POA: Diagnosis not present

## 2023-04-08 DIAGNOSIS — O4202 Full-term premature rupture of membranes, onset of labor within 24 hours of rupture: Secondary | ICD-10-CM | POA: Diagnosis not present

## 2023-04-08 HISTORY — PX: IUD INSERTION: OBO1003

## 2023-04-08 LAB — RPR: RPR Ser Ql: NONREACTIVE

## 2023-04-08 MED ORDER — WITCH HAZEL-GLYCERIN EX PADS: 1.0000 | MEDICATED_PAD | CUTANEOUS | Status: DC | PRN

## 2023-04-08 MED ORDER — DIPHENHYDRAMINE HCL 50 MG/ML IJ SOLN: 12.5000 mg | INTRAMUSCULAR | Status: DC | PRN

## 2023-04-08 MED ORDER — EPHEDRINE 5 MG/ML INJ
10.0000 mg | INTRAVENOUS | Status: AC | PRN
  Administered 2023-04-08 (×2): 10 mg via INTRAVENOUS

## 2023-04-08 MED ORDER — BENZOCAINE-MENTHOL 20-0.5 % EX AERO
1.0000 | INHALATION_SPRAY | CUTANEOUS | Status: DC | PRN
  Administered 2023-04-09: 1 via TOPICAL
  Filled 2023-04-08: qty 56

## 2023-04-08 MED ORDER — IBUPROFEN 600 MG PO TABS
600.0000 mg | ORAL_TABLET | Freq: Four times a day (QID) | ORAL | Status: DC
  Administered 2023-04-08 – 2023-04-09 (×4): 600 mg via ORAL
  Filled 2023-04-08 (×4): qty 1

## 2023-04-08 MED ORDER — PRENATAL MULTIVITAMIN CH
1.0000 | ORAL_TABLET | Freq: Every day | ORAL | Status: DC
  Administered 2023-04-09: 1 via ORAL
  Filled 2023-04-08: qty 1

## 2023-04-08 MED ORDER — LIDOCAINE-EPINEPHRINE (PF) 1.5 %-1:200000 IJ SOLN
INTRAMUSCULAR | Status: DC | PRN
  Administered 2023-04-08: 5 mL via EPIDURAL

## 2023-04-08 MED ORDER — ZOLPIDEM TARTRATE 5 MG PO TABS: 5.0000 mg | ORAL_TABLET | Freq: Every evening | ORAL | Status: DC | PRN

## 2023-04-08 MED ORDER — DIBUCAINE (PERIANAL) 1 % EX OINT: 1.0000 | TOPICAL_OINTMENT | CUTANEOUS | Status: DC | PRN

## 2023-04-08 MED ORDER — ACETAMINOPHEN 325 MG PO TABS
650.0000 mg | ORAL_TABLET | ORAL | Status: DC | PRN
  Administered 2023-04-09: 650 mg via ORAL
  Filled 2023-04-08: qty 2

## 2023-04-08 MED ORDER — EPHEDRINE 5 MG/ML INJ
10.0000 mg | INTRAVENOUS | Status: DC | PRN
  Filled 2023-04-08: qty 5

## 2023-04-08 MED ORDER — OXYCODONE HCL 5 MG PO TABS: 5.0000 mg | ORAL_TABLET | ORAL | Status: DC | PRN

## 2023-04-08 MED ORDER — COCONUT OIL OIL: 1.0000 | TOPICAL_OIL | Status: DC | PRN

## 2023-04-08 MED ORDER — PHENYLEPHRINE 80 MCG/ML (10ML) SYRINGE FOR IV PUSH (FOR BLOOD PRESSURE SUPPORT): 80.0000 ug | PREFILLED_SYRINGE | INTRAVENOUS | Status: DC | PRN

## 2023-04-08 MED ORDER — LACTATED RINGERS IV SOLN: 500.0000 mL | Freq: Once | INTRAVENOUS | Status: DC

## 2023-04-08 MED ORDER — SENNOSIDES-DOCUSATE SODIUM 8.6-50 MG PO TABS
2.0000 | ORAL_TABLET | ORAL | Status: DC
  Administered 2023-04-09: 2 via ORAL
  Filled 2023-04-08: qty 2

## 2023-04-08 MED ORDER — OXYTOCIN-SODIUM CHLORIDE 30-0.9 UT/500ML-% IV SOLN
1.0000 m[IU]/min | INTRAVENOUS | Status: DC
  Administered 2023-04-08: 2 m[IU]/min via INTRAVENOUS
  Filled 2023-04-08: qty 500

## 2023-04-08 MED ORDER — DIPHENHYDRAMINE HCL 25 MG PO CAPS: 25.0000 mg | ORAL_CAPSULE | Freq: Four times a day (QID) | ORAL | Status: DC | PRN

## 2023-04-08 MED ORDER — ONDANSETRON HCL 4 MG/2ML IJ SOLN: 4.0000 mg | INTRAMUSCULAR | Status: DC | PRN

## 2023-04-08 MED ORDER — SODIUM CHLORIDE 0.9% FLUSH: 3.0000 mL | INTRAVENOUS | Status: DC | PRN

## 2023-04-08 MED ORDER — FENTANYL-BUPIVACAINE-NACL 0.5-0.125-0.9 MG/250ML-% EP SOLN
12.0000 mL/h | EPIDURAL | Status: DC | PRN
  Administered 2023-04-08: 12 mL/h via EPIDURAL
  Filled 2023-04-08: qty 250

## 2023-04-08 MED ORDER — SODIUM CHLORIDE 0.9 % IV SOLN: INTRAVENOUS | Status: DC | PRN

## 2023-04-08 MED ORDER — SIMETHICONE 80 MG PO CHEW: 80.0000 mg | CHEWABLE_TABLET | ORAL | Status: DC | PRN

## 2023-04-08 MED ORDER — ONDANSETRON HCL 4 MG PO TABS: 4.0000 mg | ORAL_TABLET | ORAL | Status: DC | PRN

## 2023-04-08 MED ORDER — SODIUM CHLORIDE 0.9% FLUSH
3.0000 mL | Freq: Two times a day (BID) | INTRAVENOUS | Status: DC
  Administered 2023-04-08: 3 mL via INTRAVENOUS

## 2023-04-08 MED ORDER — TERBUTALINE SULFATE 1 MG/ML IJ SOLN: 0.2500 mg | Freq: Once | INTRAMUSCULAR | Status: DC | PRN

## 2023-04-08 NOTE — Anesthesia Preprocedure Evaluation (Signed)
Anesthesia Evaluation  Patient identified by MRN, date of birth, ID band Patient awake    Reviewed: Allergy & Precautions, NPO status , Patient's Chart, lab work & pertinent test results  Airway Mallampati: II  TM Distance: >3 FB Neck ROM: Full    Dental no notable dental hx.    Pulmonary neg pulmonary ROS   Pulmonary exam normal        Cardiovascular negative cardio ROS  Rhythm:Regular Rate:Normal     Neuro/Psych   Anxiety        GI/Hepatic negative GI ROS, Neg liver ROS,,,  Endo/Other  negative endocrine ROS    Renal/GU negative Renal ROS  negative genitourinary   Musculoskeletal negative musculoskeletal ROS (+)    Abdominal Normal abdominal exam  (+)   Peds  Hematology negative hematology ROS (+)   Anesthesia Other Findings   Reproductive/Obstetrics (+) Pregnancy                             Anesthesia Physical Anesthesia Plan  ASA: 2  Anesthesia Plan: Epidural   Post-op Pain Management:    Induction:   PONV Risk Score and Plan: 2 and Treatment may vary due to age or medical condition  Airway Management Planned: Natural Airway  Additional Equipment: None  Intra-op Plan:   Post-operative Plan:   Informed Consent: I have reviewed the patients History and Physical, chart, labs and discussed the procedure including the risks, benefits and alternatives for the proposed anesthesia with the patient or authorized representative who has indicated his/her understanding and acceptance.     Dental advisory given  Plan Discussed with:   Anesthesia Plan Comments: (Lab Results      Component                Value               Date                      WBC                      6.6                 04/07/2023                HGB                      9.9 (L)             04/07/2023                HCT                      30.7 (L)            04/07/2023                MCV                       86.5                04/07/2023                PLT                      188  04/07/2023           )       Anesthesia Quick Evaluation

## 2023-04-08 NOTE — Procedures (Signed)
  Post-Placental IUD Insertion Procedure Note  Patient identified, informed consent signed prior to delivery, signed copy in chart, time out was performed.    Vaginal, labial and perineal areas thoroughly inspected for lacerations. 1st degree laceration identified and repaired prior to insertion of Paragard IUD.  - IUD grasped between sterile gloved fingers. Sterile lubrication applied to sterile gloved hand for ease of insertion. Fundus identified through abdominal wall using non-insertion hand. IUD inserted to fundus with bimanual technique. IUD carefully released at the fundus and insertion hand gently removed from vagina.   -Strings not visible at the introitus. Patient tolerated procedure well.  Lot #   [id number]   Expiration Date10/21/2029  Patient given post procedure instructions and IUD care card with expiration date.  Patient is asked to keep IUD strings tucked in her vagina until her postpartum follow up visit in 4-6 weeks. Patient advised to abstain from sexual intercourse and pulling on strings before her follow-up visit. Patient verbalized an understanding of the plan of care and agrees.    Sheppard Evens MD MPH OB Fellow, Faculty Practice [facility], Center for [facility] 04/08/2023

## 2023-04-08 NOTE — Progress Notes (Signed)
Labor Progress Note Catherine Holder is a 32 y.o. G2P1001 at [redacted]w[redacted]d presented for PROM S: doing well, has an epidural and is comfortable. On pitocin at 14 units.  O:  BP (!) 108/58   Pulse 66   Temp 98.8 F (37.1 C) (Oral)   Resp 16   LMP 07/10/2022   SpO2 97%  EFM: 140bpm /moderate variability /+accels, no decels   Contractions every 3-5 mins on toco.  CVE: Dilation: 4.5 Effacement (%): 80 Cervical Position: Middle Station: -3 Presentation: Vertex Exam by:: Ndule   A&P: 32 y.o. G2P1001 [redacted]w[redacted]d with PROM at 0815 on 6/20 #Labor: Progressing well. Making gradual progress. Continue to titrate pitocin per protocol - recheck cervix in 4hrs, sooner if indicated. #Pain: Epidural in placed and comfortable #FWB:  Cat 1 #GBS negative  Kmarion Rawl Lizabeth Leyden, MD 9:44 AM

## 2023-04-08 NOTE — Discharge Summary (Signed)
Postpartum Discharge Summary  Date of Service updated***     Patient Name: Catherine Holder DOB: 16-Jun-1991 MRN: [id number]  Date of admission: 04/07/2023 Delivery date:04/08/2023  Delivering provider: Alfredia Ferguson  Date of discharge: 04/08/2023  Admitting diagnosis: PROM (premature rupture of membranes) [O42.90] Intrauterine pregnancy: [redacted]w[redacted]d     Secondary diagnosis:  Principal Problem:   PROM (premature rupture of membranes) Active Problems:   Supervision of other normal pregnancy, antepartum   SVD (spontaneous vaginal delivery)  Additional problems: ***    Discharge diagnosis: Term Pregnancy Delivered and Anemia                                              Post partum procedures:{Postpartum procedures:23558} Augmentation: Pitocin Complications: ROM>24 hours  Hospital course: Onset of Labor With Vaginal Delivery      32 y.o. yo G2P2002 at [redacted]w[redacted]d was admitted in Latent Labor following PROM on 04/07/2023. Labor course was complicated by: none  Membrane Rupture Time/Date: 8:15 AM ,04/07/2023   Delivery Method:Vaginal, Spontaneous  Episiotomy: None  Lacerations:  1st degree;Perineal  Patient had a postpartum course complicated by ***.  She is ambulating, tolerating a regular diet, passing flatus, and urinating well. Patient is discharged home in stable condition on 04/08/23.  Newborn Data: Birth date:04/08/2023  Birth time:5:34 PM  Gender:Female  Living status:Living  Apgars:9 ,9  Weight:   Magnesium Sulfate received: No BMZ received: No Rhophylac:N/A MMR:N/A T-DaP:Given prenatally Flu: {WUJ:81191} Transfusion:{Transfusion received:30440034}  Physical exam  Vitals:   04/08/23 1531 04/08/23 1601 04/08/23 1631 04/08/23 1701  BP: 109/63 (!) 102/59 111/83 120/73  Pulse: 75 72 78 89  Resp:      Temp:      TempSrc:      SpO2:       General: {Exam; general:21111117} Lochia: {Desc; appropriate/inappropriate:30686::"appropriate"} Uterine Fundus: {Desc;  firm/soft:30687} Incision: {Exam; incision:21111123} DVT Evaluation: {Exam; dvt:2111122} Labs: Lab Results  Component Value Date   WBC 6.6 04/07/2023   HGB 9.9 (L) 04/07/2023   HCT 30.7 (L) 04/07/2023   MCV 86.5 04/07/2023   PLT 188 04/07/2023      Latest Ref Rng & Units 01/03/2020   10:27 AM  CMP  Glucose 65 - 99 mg/dL 85   BUN 6 - 20 mg/dL 6   Creatinine 4.78 - 2.95 mg/dL 6.21   Sodium 308 - 657 mmol/L 139   Potassium 3.5 - 5.2 mmol/L 3.8   Chloride 96 - 106 mmol/L 104   CO2 20 - 29 mmol/L 19   Calcium 8.7 - 10.2 mg/dL 9.3   Total Protein 6.0 - 8.5 g/dL 6.9   Total Bilirubin 0.0 - 1.2 mg/dL <8.4   Alkaline Phos 39 - 117 IU/L 40   AST 0 - 40 IU/L 11   ALT 0 - 32 IU/L 7    Edinburgh Score:    08/05/2020   10:23 AM  Edinburgh Postnatal Depression Scale Screening Tool  I have been able to laugh and see the funny side of things. 0  I have looked forward with enjoyment to things. 0  I have blamed myself unnecessarily when things went wrong. 0  I have been anxious or worried for no good reason. 0  I have felt scared or panicky for no good reason. 0  Things have been getting on top of me. 0  I have been so unhappy  that I have had difficulty sleeping. 0  I have felt sad or miserable. 0  I have been so unhappy that I have been crying. 0  The thought of harming myself has occurred to me. 0  Edinburgh Postnatal Depression Scale Total 0     After visit meds:  Allergies as of 04/08/2023   No Known Allergies   Med Rec must be completed prior to using this [facility]***        Discharge home in stable condition Infant Feeding: {Baby feeding:23562} Infant Disposition:{CHL IP OB HOME WITH WUJWJX:91478} Discharge instruction: per After Visit Summary and Postpartum booklet. Activity: Advance as tolerated. Pelvic rest for 6 weeks.  Diet: {OB GNFA:21308657} Future Appointments:No future appointments. Follow up Visit:  The following message was sent to [facility]  Please  schedule this patient for a In person postpartum visit in 4-6 weeks with the following provider: Any provider. Additional Postpartum F/U: none   Low risk pregnancy complicated by:  none Delivery mode:  Vaginal, Spontaneous  Anticipated Birth Control:  PP IUD placed   04/08/2023 Eleena Grater Lizabeth Leyden, MD

## 2023-04-08 NOTE — Lactation Note (Addendum)
This note was copied from a baby's chart. Lactation Consultation Note  Patient Name: Catherine Holder [id number] Date: 04/08/2023 Age:33 hours Reason for consult: Initial assessment;Term LC entered the room, Per Birth Parent infant recently formula feed 15 mls of formula at 2200 pm. Her current feeding choice is breast and formula feed. Infant has latched well 3 times since birth and formula fed twice. Birth Parent will continue to BF infant according to hunger cues, on demand, 8 to 12+ times within 24 hours, skin to skin. Birth Parent informed LC she will formula fed infant tonight and resume BF in the morning this is her feeding choice. LC did not observe latch tonight due infant recently being formula feed. Birth Parent knows to call RN/ LC for latch assistance if needed. LC discussed infant's input and output. The importance of maternal rest, diet and hydration. Birth Parent was  made aware of O/P services, breastfeeding support groups, community resources, and our phone # for post-discharge questions.    Maternal Data Has patient been taught Hand Expression?: Yes Does the patient have breastfeeding experience prior to this delivery?: Yes How long did the patient breastfeed?: Per Birth Parent, she BF her 8 year old child for 31/2 months.  Feeding Mother's Current Feeding Choice: Breast Milk and Formula  LATCH Score  LC did not observe latch due to infant being formula feed at 22 00 pm.                   Lactation Tools Discussed/Used  Per Birth Parent, she has DEBP at home.  Interventions Interventions: Breast feeding basics reviewed;Skin to skin;Education;LC Services brochure  Discharge    Consult Status Consult Status: Follow-up Date: 04/09/23 Follow-up type: In-patient    Frederico Hamman 04/08/2023, 11:59 PM

## 2023-04-08 NOTE — Anesthesia Procedure Notes (Signed)
Epidural Patient location during procedure: OB Start time: 04/08/2023 3:35 AM End time: 04/08/2023 3:43 AM  Staffing Anesthesiologist: Atilano Median, DO Performed: anesthesiologist   Preanesthetic Checklist Completed: patient identified, IV checked, site marked, risks and benefits discussed, surgical consent, monitors and equipment checked, pre-op evaluation and timeout performed  Epidural Patient position: sitting Prep: ChloraPrep Patient monitoring: heart rate, continuous pulse ox and blood pressure Approach: midline Location: L3-L4 Injection technique: LOR saline  Needle:  Needle type: Tuohy  Needle gauge: 17 G Needle length: 9 cm Needle insertion depth: 7 cm Catheter type: closed end flexible Catheter size: 20 Guage Catheter at skin depth: 13 cm Test dose: negative and 1.5% lidocaine with Epi 1:200 K  Assessment Events: blood not aspirated, no cerebrospinal fluid, injection not painful, no injection resistance and no paresthesia  Additional Notes  Patient identified. Risks/Benefits/Options discussed with patient including but not limited to bleeding, infection, nerve damage, paralysis, failed block, incomplete pain control, headache, blood pressure changes, nausea, vomiting, reactions to medications, itching and postpartum back pain. Confirmed with bedside nurse the patient's most recent platelet count. Confirmed with patient that they are not currently taking any anticoagulation, have any bleeding history or any family history of bleeding disorders. Patient expressed understanding and wished to proceed. All questions were answered. Sterile technique was used throughout the entire procedure. Please see nursing notes for vital signs. Test dose was given through epidural catheter and negative prior to continuing to dose epidural or start infusion. Warning signs of high block given to the patient including shortness of breath, tingling/numbness in hands, complete motor block,  or any concerning symptoms with instructions to call for help. Patient was given instructions on fall risk and not to get out of bed. All questions and concerns addressed with instructions to call with any issues or inadequate analgesia.    Reason for block:procedure for pain

## 2023-04-09 LAB — CBC
HCT: 28.5 % — ABNORMAL LOW (ref 36.0–46.0)
Hemoglobin: 9 g/dL — ABNORMAL LOW (ref 12.0–15.0)
MCH: 27.5 pg (ref 26.0–34.0)
MCHC: 31.6 g/dL (ref 30.0–36.0)
MCV: 87.2 fL (ref 80.0–100.0)
Platelets: 165 10*3/uL (ref 150–400)
RBC: 3.27 MIL/uL — ABNORMAL LOW (ref 3.87–5.11)
RDW: 13.4 % (ref 11.5–15.5)
WBC: 9.1 10*3/uL (ref 4.0–10.5)
nRBC: 0 % (ref 0.0–0.2)

## 2023-04-09 MED ORDER — SENNOSIDES-DOCUSATE SODIUM 8.6-50 MG PO TABS: 2.0000 | ORAL_TABLET | Freq: Every day | ORAL | 0 refills | Status: DC | PRN

## 2023-04-09 MED ORDER — FERROUS SULFATE 325 (65 FE) MG PO TABS: 325.0000 mg | ORAL_TABLET | ORAL | 0 refills | Status: AC

## 2023-04-09 MED ORDER — IBUPROFEN 600 MG PO TABS: 600.0000 mg | ORAL_TABLET | Freq: Three times a day (TID) | ORAL | 0 refills | Status: DC | PRN

## 2023-04-09 MED ORDER — FERROUS SULFATE 325 (65 FE) MG PO TABS
325.0000 mg | ORAL_TABLET | ORAL | Status: DC
  Administered 2023-04-09: 325 mg via ORAL
  Filled 2023-04-09: qty 1

## 2023-04-09 NOTE — Anesthesia Postprocedure Evaluation (Signed)
Anesthesia Post Note  Patient: Catherine Holder  Procedure(s) Performed: AN AD HOC LABOR EPIDURAL     Patient location during evaluation: Mother Baby Anesthesia Type: Epidural Level of consciousness: awake and alert Pain management: pain level controlled Vital Signs Assessment: post-procedure vital signs reviewed and stable Respiratory status: spontaneous breathing, nonlabored ventilation and respiratory function stable Cardiovascular status: stable Postop Assessment: no headache, no backache and epidural receding Anesthetic complications: no  No notable events documented.  Last Vitals:  Vitals:   04/09/23 0505 04/09/23 0918  BP: 119/71 (!) 100/50  Pulse: 62 69  Resp: 16 18  Temp: (!) 36.4 C 36.6 C  SpO2: 100% 100%    Last Pain:  Vitals:   04/09/23 0918  TempSrc: Oral  PainSc: 0-No pain   Pain Goal:                   Trellis Paganini

## 2023-04-09 NOTE — Progress Notes (Signed)
MOB was referred for history of depression/anxiety. * Referral screened out by Clinical Social Worker because none of the following criteria appear to apply: ~ History of anxiety/depression during this pregnancy, or of post-partum depression following prior delivery. ~ Diagnosis of anxiety and/or depression within last 3 years.   Per chart review, MOB was diagnosed with anxiety associated with birthing process 3 years ago, 03/27/2020. History of anxiety was addressed during prenatal care appointments, no concerns noted during current pregnancy/delivery of infant. Edinburgh Postnatal Depression Scale score of 1 on current admission.  OR * MOB's symptoms currently being treated with medication and/or therapy. Please contact the Clinical Social Worker if needs arise, by [facility] request, or if MOB scores greater than 9/yes to question 10 on Edinburgh Postpartum Depression Screen.  Signed,  Norberto Sorenson, MSW, LCSWA, LCASA 04/09/2023 9:42 AM

## 2023-04-15 ENCOUNTER — Inpatient Hospital Stay (HOSPITAL_COMMUNITY): Payer: Medicaid Other

## 2023-04-15 ENCOUNTER — Encounter (HOSPITAL_COMMUNITY): Payer: Medicaid Other

## 2023-04-15 ENCOUNTER — Inpatient Hospital Stay (HOSPITAL_COMMUNITY): Admission: AD | Admit: 2023-04-15 | Payer: Medicaid Other | Source: Home / Self Care | Admitting: Family Medicine

## 2023-05-02 ENCOUNTER — Ambulatory Visit: Payer: Medicaid Other | Admitting: Obstetrics & Gynecology

## 2023-05-02 ENCOUNTER — Telehealth (HOSPITAL_COMMUNITY): Payer: Self-pay | Admitting: *Deleted

## 2023-05-02 NOTE — Telephone Encounter (Signed)
05/02/2023  Name: Catherine Holder MRN: [id number] DOB: November 08, 1990  Reason for Call:  Transition of Care Hospital Discharge Call  Contact Status: Patient Contact Status: Complete  Language assistant needed:          Follow-Up Questions: Do You Have Any Concerns About Your Health As You Heal From Delivery?: No Do You Have Any Concerns About Your Infants Health?: Yes What Concerns Do You Have About Your Baby?: Patient reported that infant has a rash. No fever. Stated that baby has an appointment with pediatrician scheduled for tomorrow to evaluate.  Edinburgh Postnatal Depression Scale:  In the Past 7 Days:   EPDS not completed at this time. Patient reported having completed EPDS with home visiting nurse. Stated, "I'm not depressed." PHQ2-9 Depression Scale:     Discharge Follow-up:    Post-discharge interventions: Reviewed Newborn Safe Sleep Practices  Signature Deforest Hoyles, RN, [phone]

## 2023-05-26 ENCOUNTER — Encounter: Payer: Self-pay | Admitting: Family Medicine

## 2023-05-26 ENCOUNTER — Ambulatory Visit (INDEPENDENT_AMBULATORY_CARE_PROVIDER_SITE_OTHER): Payer: Medicaid Other | Admitting: Family Medicine

## 2023-05-26 DIAGNOSIS — Z975 Presence of (intrauterine) contraceptive device: Secondary | ICD-10-CM

## 2023-05-26 DIAGNOSIS — K5900 Constipation, unspecified: Secondary | ICD-10-CM | POA: Diagnosis not present

## 2023-05-26 DIAGNOSIS — D649 Anemia, unspecified: Secondary | ICD-10-CM

## 2023-05-26 MED ORDER — POLYETHYLENE GLYCOL 3350 17 GM/SCOOP PO POWD
17.0000 g | Freq: Every day | ORAL | 0 refills | Status: AC
Start: 2023-05-26 — End: ?

## 2023-05-26 NOTE — Progress Notes (Signed)
..   Post Partum Visit Note  Catherine Holder is a 32 y.o. G12P2002 female who presents for a postpartum visit. She is 7 weeks postpartum following a normal spontaneous vaginal delivery.  I have fully reviewed the prenatal and intrapartum course. The delivery was at 40.0 gestational weeks.  Anesthesia: epidural. Postpartum course has been good. Baby is doing well. Baby is feeding by both breast and bottle - Similac Advance. Bleeding no bleeding. Bowel function is abnormal: constipated . Bladder function is normal. Patient is not sexually active. Contraception method is IUD. Postpartum depression screening: negative.   The pregnancy intention screening data noted above was reviewed. Potential methods of contraception were discussed. The patient elected to proceed with No data recorded.   Edinburgh Postnatal Depression Scale - 05/26/23 1125       Edinburgh Postnatal Depression Scale:  In the Past 7 Days   I have been able to laugh and see the funny side of things. 0    I have looked forward with enjoyment to things. 0    I have blamed myself unnecessarily when things went wrong. 0    I have been anxious or worried for no good reason. 0    I have felt scared or panicky for no good reason. 0    Things have been getting on top of me. 2    I have been so unhappy that I have had difficulty sleeping. 0    I have felt sad or miserable. 0    I have been so unhappy that I have been crying. 1    The thought of harming myself has occurred to me. 0    Edinburgh Postnatal Depression Scale Total 3            Health Maintenance Due  Topic Date Due   COVID-19 Vaccine (1 - 2023-24 season) Never done   INFLUENZA VACCINE  05/19/2023    The following portions of the patient's history were reviewed and updated as appropriate: allergies, current medications, past family history, past medical history, past social history, past surgical history, and problem list.  Review of Systems Gastrointestinal:  positive for constipation  Objective:  BP 101/67   Pulse (!) 58   Wt 174 lb 9.6 oz (79.2 kg)   Breastfeeding Yes   BMI 31.93 kg/m    General:  alert, cooperative, and appears stated age   Breasts:  not indicated  Lungs: No respiratory distress  Heart:  regular rate and rhythm  Abdomen: soft, non-tender; bowel sounds normal; no masses,  no organomegaly   Wound N/a  GU exam:  normal, perineal wound completely healed. Speculum exam shows visible IUD strings, of appropriate length protruding through the cervix.       Assessment:   Postpartum examination following vaginal delivery Normal postpartum exam. Doing well overall. 1st degree laceration healed.   Constipation, unspecified constipation type Concerning to patient. Rx for miralax sent. Recommended increasing fluids and fibre intake  Anemia, unspecified type Hgb 9.0 at discharge. Stopped PO iron due to constipation. Recommended iron rich food. Will recheck hgb at patient's request. -     CBC  IUD in place. Paragard IUD strings visible. Due for removal 04/07/2033  Plan:   Essential components of care per ACOG recommendations:  1.  Mood and well being: Patient with negative depression screening today. Reviewed local resources for support.  - Patient tobacco use? No.   - hx of drug use? No.    2. Infant care and feeding:  -  Patient currently breastmilk feeding? Yes. Reviewed importance of draining breast regularly to support lactation.  -Social determinants of health (SDOH) reviewed in EPIC. No concerns  3. Sexuality, contraception and birth spacing - Patient does not want a pregnancy in the next year.  Desired family size is unknown. - Reviewed reproductive life planning. Reviewed contraceptive methods based on pt preferences and effectiveness.  Patient already has paragard IUD which is in place. - Discussed birth spacing of 18 months  4. Sleep and fatigue -Encouraged family/partner/community support of 4 hrs of  uninterrupted sleep to help with mood and fatigue  5. Physical Recovery  - Discussed patients delivery and complications. She describes her labor as good. - Patient had a Vaginal, no problems at delivery. Patient had a 1st degree laceration. Perineal healing reviewed. Patient expressed understanding - Patient has urinary incontinence? No. - Patient is safe to resume physical and sexual activity  6.  Health Maintenance - HM due items addressed Yes - Last pap smear - HPV also negative Diagnosis  Date Value Ref Range Status  08/05/2020   Final   - Negative for intraepithelial lesion or malignancy (NILM)   Pap smear not done at today's visit.  -Breast Cancer screening indicated? No.   7. Chronic Disease/Pregnancy Condition follow up: Anemia As above  - PCP follow up  Sheppard Evens MD MPH OB Fellow, Faculty Practice [facility], [facility] 05/26/2023

## 2023-06-06 ENCOUNTER — Other Ambulatory Visit: Payer: Self-pay | Admitting: Family Medicine

## 2023-07-01 ENCOUNTER — Encounter: Payer: Self-pay | Admitting: Student

## 2023-07-01 ENCOUNTER — Ambulatory Visit (INDEPENDENT_AMBULATORY_CARE_PROVIDER_SITE_OTHER): Payer: Medicaid Other | Admitting: Student

## 2023-07-01 VITALS — BP 121/75 | HR 61 | Wt 175.2 lb

## 2023-07-01 DIAGNOSIS — Z30432 Encounter for removal of intrauterine contraceptive device: Secondary | ICD-10-CM

## 2023-07-01 DIAGNOSIS — R519 Headache, unspecified: Secondary | ICD-10-CM | POA: Diagnosis not present

## 2023-07-01 NOTE — Progress Notes (Signed)
CC: IUD check   Pt may want it removed ?   Strings are pinching, pt having headaches

## 2023-07-01 NOTE — Progress Notes (Signed)
    GYNECOLOGY CLINIC PROCEDURE NOTE  [name] is a 32 y.o. [phone] here for  IUD removal. Patient would like to allow her body to transition to baseline without any additional contraceptive methods in place. Patient also has noticed irritation from the strings. No other GYN concerns.  Last pap smear was on 2021 and was normal. Patient also notes onset of headaches during the postpartum period.  IUD Removal  Patient was in the dorsal lithotomy position, normal external genitalia was noted.  A speculum was placed in the patient's vagina, normal discharge was noted, no lesions. The multiparous cervix was visualized, no lesions, no abnormal discharge.  The strings of the IUD were grasped and pulled using ring forceps. The IUD was removed in its entirety. Patient tolerated the procedure well.    Patient does not desire further contraception management at this time. Routine preventative health maintenance measures emphasized.  Assessment & Plan:   1. Encounter for IUD removal - successful removal - return to clinic if desiring alternative option  2. Generalized headaches - Experienced anemia surrounding recent pregnancy and is currently breastfeeding. Plan to screen for anemia, electrolyte deficiency or vitamin deficiency - CBC - Comprehensive metabolic panel - Vitamin B12 - Vitamin D (25 hydroxy) - Will manage appropriately based on results   Catherine Hove, NP 07/01/2023 9:42 AM

## 2023-07-02 LAB — CBC
Hematocrit: 39.5 % (ref 34.0–46.6)
Hemoglobin: 12.6 g/dL (ref 11.1–15.9)
MCH: 27.9 pg (ref 26.6–33.0)
MCHC: 31.9 g/dL (ref 31.5–35.7)
MCV: 88 fL (ref 79–97)
Platelets: 235 10*3/uL (ref 150–450)
RBC: 4.51 x10E6/uL (ref 3.77–5.28)
RDW: 16.9 % — ABNORMAL HIGH (ref 11.7–15.4)
WBC: 5.1 10*3/uL (ref 3.4–10.8)

## 2023-07-02 LAB — COMPREHENSIVE METABOLIC PANEL
ALT: 20 IU/L (ref 0–32)
AST: 18 IU/L (ref 0–40)
Albumin: 4.4 g/dL (ref 3.9–4.9)
Alkaline Phosphatase: 88 IU/L (ref 44–121)
BUN/Creatinine Ratio: 18 (ref 9–23)
BUN: 13 mg/dL (ref 6–20)
Bilirubin Total: <0.2 mg/dL (ref 0.0–1.2)
CO2: 23 mmol/L (ref 20–29)
Calcium: 9.5 mg/dL (ref 8.7–10.2)
Chloride: 103 mmol/L (ref 96–106)
Creatinine, Ser: 0.73 mg/dL (ref 0.57–1.00)
Globulin, Total: 2.9 g/dL (ref 1.5–4.5)
Glucose: 92 mg/dL (ref 70–99)
Potassium: 4.3 mmol/L (ref 3.5–5.2)
Sodium: 142 mmol/L (ref 134–144)
Total Protein: 7.3 g/dL (ref 6.0–8.5)
eGFR: 111 mL/min/{1.73_m2} (ref >59)

## 2023-07-02 LAB — VITAMIN B12: Vitamin B-12: 695 pg/mL (ref 232–1245)

## 2023-07-02 LAB — VITAMIN D 25 HYDROXY (VIT D DEFICIENCY, FRACTURES): Vit D, 25-Hydroxy: 26.6 ng/mL — ABNORMAL LOW (ref 30.0–100.0)
